# Patient Record
Sex: Female | Born: 1965 | Race: White | Hispanic: No | Marital: Married | Smoking: Never smoker
Health system: Southern US, Community
[De-identification: ages and names within clinical notes are randomized; demographics above are authoritative.]

## PROBLEM LIST (undated history)

## (undated) DIAGNOSIS — N921 Excessive and frequent menstruation with irregular cycle: Secondary | ICD-10-CM

## (undated) DIAGNOSIS — R519 Headache, unspecified: Secondary | ICD-10-CM

## (undated) DIAGNOSIS — K219 Gastro-esophageal reflux disease without esophagitis: Secondary | ICD-10-CM

## (undated) DIAGNOSIS — D649 Anemia, unspecified: Secondary | ICD-10-CM

## (undated) DIAGNOSIS — I809 Phlebitis and thrombophlebitis of unspecified site: Secondary | ICD-10-CM

## (undated) DIAGNOSIS — M1990 Unspecified osteoarthritis, unspecified site: Secondary | ICD-10-CM

## (undated) DIAGNOSIS — R002 Palpitations: Secondary | ICD-10-CM

## (undated) DIAGNOSIS — R6889 Other general symptoms and signs: Secondary | ICD-10-CM

## (undated) DIAGNOSIS — R51 Headache: Secondary | ICD-10-CM

## (undated) DIAGNOSIS — L0390 Cellulitis, unspecified: Secondary | ICD-10-CM

## (undated) HISTORY — DX: Excessive and frequent menstruation with irregular cycle: N92.1

## (undated) HISTORY — PX: BUNIONECTOMY: SHX129

## (undated) HISTORY — DX: Palpitations: R00.2

## (undated) HISTORY — DX: Cellulitis, unspecified: L03.90

## (undated) HISTORY — DX: Phlebitis and thrombophlebitis of unspecified site: I80.9

## (undated) HISTORY — PX: VAGINAL HYSTERECTOMY: SUR661

## (undated) HISTORY — PX: OTHER SURGICAL HISTORY: SHX169

---

## 1997-05-17 ENCOUNTER — Ambulatory Visit (HOSPITAL_COMMUNITY): Admission: RE | Admit: 1997-05-17 | Discharge: 1997-05-17 | Payer: Self-pay | Admitting: Obstetrics and Gynecology

## 1999-05-22 ENCOUNTER — Other Ambulatory Visit: Admission: RE | Admit: 1999-05-22 | Discharge: 1999-05-22 | Payer: Self-pay | Admitting: Obstetrics and Gynecology

## 2000-04-25 ENCOUNTER — Observation Stay (HOSPITAL_COMMUNITY): Admission: RE | Admit: 2000-04-25 | Discharge: 2000-04-26 | Payer: Self-pay | Admitting: Physical Therapy

## 2000-04-25 ENCOUNTER — Encounter (INDEPENDENT_AMBULATORY_CARE_PROVIDER_SITE_OTHER): Payer: Self-pay

## 2000-04-25 DIAGNOSIS — N921 Excessive and frequent menstruation with irregular cycle: Secondary | ICD-10-CM

## 2000-04-25 HISTORY — DX: Excessive and frequent menstruation with irregular cycle: N92.1

## 2001-05-27 ENCOUNTER — Encounter: Admission: RE | Admit: 2001-05-27 | Discharge: 2001-05-27 | Payer: Self-pay | Admitting: Otolaryngology

## 2001-05-27 ENCOUNTER — Encounter: Payer: Self-pay | Admitting: Otolaryngology

## 2002-02-25 ENCOUNTER — Encounter: Admission: RE | Admit: 2002-02-25 | Discharge: 2002-02-25 | Payer: Self-pay | Admitting: Family Medicine

## 2002-02-25 ENCOUNTER — Encounter: Payer: Self-pay | Admitting: Family Medicine

## 2002-08-11 ENCOUNTER — Other Ambulatory Visit: Admission: RE | Admit: 2002-08-11 | Discharge: 2002-08-11 | Payer: Self-pay | Admitting: Obstetrics and Gynecology

## 2003-09-21 ENCOUNTER — Other Ambulatory Visit: Admission: RE | Admit: 2003-09-21 | Discharge: 2003-09-21 | Payer: Self-pay | Admitting: Obstetrics and Gynecology

## 2004-10-11 ENCOUNTER — Other Ambulatory Visit: Admission: RE | Admit: 2004-10-11 | Discharge: 2004-10-11 | Payer: Self-pay | Admitting: Obstetrics and Gynecology

## 2005-06-29 ENCOUNTER — Encounter: Admission: RE | Admit: 2005-06-29 | Discharge: 2005-06-29 | Payer: Self-pay | Admitting: Otolaryngology

## 2006-08-07 ENCOUNTER — Encounter: Admission: RE | Admit: 2006-08-07 | Discharge: 2006-08-07 | Payer: Self-pay | Admitting: Family Medicine

## 2007-04-07 ENCOUNTER — Encounter: Admission: RE | Admit: 2007-04-07 | Discharge: 2007-04-07 | Payer: Self-pay | Admitting: Obstetrics and Gynecology

## 2007-07-09 ENCOUNTER — Ambulatory Visit (HOSPITAL_COMMUNITY): Admission: RE | Admit: 2007-07-09 | Discharge: 2007-07-09 | Payer: Self-pay | Admitting: Family Medicine

## 2009-09-07 ENCOUNTER — Ambulatory Visit (HOSPITAL_COMMUNITY): Admission: RE | Admit: 2009-09-07 | Discharge: 2009-09-07 | Payer: Self-pay | Admitting: Family Medicine

## 2010-02-20 ENCOUNTER — Encounter (HOSPITAL_COMMUNITY)
Admission: RE | Admit: 2010-02-20 | Discharge: 2010-05-02 | Payer: Self-pay | Source: Home / Self Care | Attending: Endocrinology | Admitting: Endocrinology

## 2010-08-18 NOTE — Op Note (Signed)
[facility]  Patient:    [name], [name]                          MRN: [id number] Proc. Date: 04/25/00 Attending:  Trevor Iha, M.D.                           Operative Report  PREOPERATIVE DIAGNOSIS:       Menometrorrhagia.  POSTOPERATIVE DIAGNOSES:      1. Menometrorrhagia.                               2. Right paratubal cyst x 2.  OPERATION/PROCEDURE:          1. Laparoscopically assisted vaginal                                  hysterectomy.                               2. Removal of right paratubal cysts.  SURGEON:                      Trevor Iha, M.D.  ASSISTANT:                    Raynald Kemp, M.D.  ANESTHESIA:                   General endotracheal.  INDICATIONS FOR PROCEDURE:    Ms. Herring is a 45 year old G3 P3 with worsening menometrorrhagia not responsive to conservative medical management including anti-inflammatory medications, oral contraceptive agents, or dilatation and curettage.  She desires definitive surgical intervention and requests hysterectomy.  The plan is for laparoscopically assisted vaginal hysterectomy. The risks and benefits were discussed at length and informed consent was obtained.  See the History of Present Illness for further details.  FINDINGS:                     At the time of surgery there was a normal appearing appendix noted and liver.  The ovaries were normal bilaterally.  The The right tube had two hydatid cysts of Morgagni or paratubal cysts, which were removed.  DESCRIPTION OF PROCEDURE:     After adequate general anesthesia was obtained the patient was placed in the dorsal lithotomy position on the operating room table and was sterile prepped and draped.  A Hulka tenaculum was sterilely placed and the bladder was sterilely drained.  A 1 cm infraumbilical skin incision was made and a Veress needle inserted.  The abdomen was insufflated with dullness to percussion and an 11 mm trocar  inserted.  A five mm trocar was inserted two fingerbreadths below the pubic symphysis just to the left of the midline under direct visualization.  The above findings were noted.  An Endo GIA was inserted and placed across the left utero-ovarian ligament, across to the round ligament, and fired one time.  The ovary and tube fell away nicely from the uterus and good hemostasis was achieved.  This was done similarly on the right side with one firing of the Endo GIA, and good application was noted and hemostasis was noted after removal.  At this time the laparoscope  was removed, the Hulka tenaculum removed, and a Jacobs tenaculum placed on the cervix, and a weighted speculum placed.  Posterior colpotomy incision was made and the cervix was circumscribed with Bovie cautery.  Heaney clamps were placed across the uterosacral ligaments bilaterally and they were clamped, cut, and tied, with 0 Monocryl used throughout the remainder of the procedure unless otherwise indicated, doubly ligated with 0 Monocryl suture.  The cardinal ligaments were then clamped, cut, and tied.  The bladder was then dissected off the anterior surface of the cervix and replaced underneath a Deaver retractor, and at this time successive bites along the uterine vasculature up and across the round ligaments were performed.  At this time the fundus of the uterus was grasped with a thyroid tenaculum and delivered up to the introitus, and the remaining pedicle was clamped bilaterally with Heaney clamps, cut with Mayo scissors, and the uterus removed intact.  Many pedicles were doubly ligated and at this time a fair amount of bleeding was noted from the posterior vaginal cuff.  It was contained with Bovie cautery and at this time a small packing was placed. Sutures were placed at the uterosacral ligaments bilaterally.  The posterior peritoneum was then closed in pursestring fashion.  The posterior vagina was closed in vertical  fashion using figure-of-eight 0 Monocryl with good approximation and good hemostasis.  The tenaculum was then removed and the anterior vagina then closed with figure-of-eight sutures of 0 Monocryl with good approximation and good hemostasis noted.  At this time the speculum was removed and the Foley catheter sterilely placed with good return of clear urine.  The abdomen was again insufflated and at this time Bovie cautery was used to catheterize the base of the right paratubal cyst.  It was sharply excised and removed and the ______ suction irrigator was used to irrigate the cul-de-sac and also the right tube.  Hemostasis was achieved with bipolar cautery.  After adequate hemostasis was assured and after copious irrigation the laparoscope and trocars were removed.  The infraumbilical skin incision was closed with 0 Vicryl in the fascia and 3-0 Vicryl Rapide subcuticular stitch in the skin.  The 5 mm trocar site was closed with 3-0 Vicryl Rapide subcuticular stitch.  There was good approximation and good hemostasis noted. They were injected with Marcaine 0.25%.  The patient tolerated the procedure well and was taken to the recovery room.  Estimated blood loss for the procedure was 325 cc.  The patient received Cipro 400 mg IV preoperatively. DD:  04/25/00 TD:  04/25/00 Job: [id number]

## 2010-08-18 NOTE — Discharge Summary (Signed)
[facility]  Patient:    [name], [name]                        MRN: [id number] Adm. Date:  [id number] Disc. Date: 04/26/00 Attending:  Trevor Iha                           Discharge Summary  HISTORY OF PRESENT ILLNESS:   [name] is a 45 year old G3, P3 with menometrorrhagia not responsive to oral contraceptive agents, D&C, or anti-inflammatory medications.  She presents for definitive surgical correction and presented for hysterectomy.  HOSPITAL COURSE:              Patient was admitted same day of surgery. Underwent a laparoscopically assisted vaginal hysterectomy and removal of right peritubal cyst.  Surgery was uncomplicated.  Estimated blood loss 325 cc.  Her postoperative course was uncomplicated.  She had good return of bowel function and ambulation.  Able to urinate without a catheter.  By postoperative day #1 was ambulating without difficulty, tolerating a regular diet.  She did have temperature of 101.1 on the morning of postoperative day #1.  Had IV site that had infiltrated and mild phlebitis.  The IV was removed and she rapidly defervesced.  Incision was clean, dry, and intact at the time of discharge.  Bowel sounds were positive.  Otherwise normal physical examination.  Hemoglobin postoperative day #1 was 9.0 and a normal white blood count.  DISPOSITION:                  Patient will be discharged home.  Will follow-up in the office in one and a half weeks.  She was told to return for any increased bleeding, pelvic pain, or fever greater than 100.5.  She was sent home with a prescription for Motrin 800 mg, Tylox #30, and a Z-pak for the phlebitis and possible skin cellulitis.  CONDITION ON DISCHARGE:       Good. DD:  04/26/00 TD:  04/26/00 Job: [id number]

## 2010-08-25 ENCOUNTER — Encounter: Payer: Self-pay | Admitting: Cardiology

## 2010-08-30 ENCOUNTER — Ambulatory Visit (INDEPENDENT_AMBULATORY_CARE_PROVIDER_SITE_OTHER): Payer: 59 | Admitting: Cardiology

## 2010-08-30 ENCOUNTER — Encounter: Payer: Self-pay | Admitting: Cardiology

## 2010-08-30 VITALS — BP 122/70 | HR 58 | Resp 14 | Ht 66.0 in | Wt 182.0 lb

## 2010-08-30 DIAGNOSIS — R002 Palpitations: Secondary | ICD-10-CM

## 2010-08-30 DIAGNOSIS — I4949 Other premature depolarization: Secondary | ICD-10-CM

## 2010-08-30 DIAGNOSIS — I493 Ventricular premature depolarization: Secondary | ICD-10-CM

## 2010-08-30 MED ORDER — METOPROLOL SUCCINATE ER 25 MG PO TB24: 25.0000 mg | ORAL_TABLET | Freq: Two times a day (BID) | ORAL | Status: DC

## 2010-08-30 NOTE — Progress Notes (Signed)
[name] is referred today by Dr. Rana Snare for the evaluation and management of continuing palpitations.  She is currently 45 years of age. He has had a history of palpitations in the past and has been told she has premature beats. After menopause, she developed Graves' disease. During that time her palpitations were significantly worse. After being treated, and being currently euthyroid, she still having a fair amount of PVCs.  There were some she lies down or when she gets up. They also seem to be worse with activity. She denies any chest discomfort or angina. She's had no syncope or presyncope.  She is not had an echocardiogram.  EKG today shows normal sinus rhythm with a normal PR, normal QRS and normal QTC. She does have a PVC.  HPI  Past Medical History  Diagnosis Date  . Menometrorrhagia 04/25/2000    Right parotubal cyst x 2.  . Phlebitis   . Cellulitis   . Palpitation     Past Surgical History  Procedure Date  . Vaginal hysterectomy     removal of paratubal cyst.    No family history on file.  History   Social History  . Marital Status: Married    Spouse Name: N/A    Number of Children: N/A  . Years of Education: N/A   Occupational History  . Not on file.   Social History Main Topics  . Smoking status: Never Smoker   . Smokeless tobacco: Not on file  . Alcohol Use: Not on file  . Drug Use: Not on file  . Sexually Active: Not on file   Other Topics Concern  . Not on file   Social History Narrative  . No narrative on file    Allergies  Allergen Reactions  . Penicillins   . Sulfa Drugs Cross Reactors     Current Outpatient Prescriptions  Medication Sig Dispense Refill  . escitalopram (LEXAPRO) 10 MG tablet Take 10 mg by mouth daily.        Marland Kitchen estradiol (ESTRACE) 2 MG tablet Take 2 mg by mouth daily.        Marland Kitchen levothyroxine (SYNTHROID, LEVOTHROID) 88 MCG tablet Take 88 mcg by mouth daily.        . metoprolol succinate (TOPROL-XL) 25 MG 24 hr tablet Take  1 tablet (25 mg total) by mouth 2 (two) times daily.  60 tablet  11  . DISCONTD: metoprolol succinate (TOPROL-XL) 25 MG 24 hr tablet Take 25 mg by mouth daily.          ROS Negative other than HPI.   PE General Appearance: well developed, well nourished in no acute distress HEENT: symmetrical face, PERRLA, good dentition  Neck: no JVD, thyromegaly, or adenopathy, trachea midline Chest: symmetric without deformity Cardiac: PMI non-displaced, RRR, normal S1, S2, no gallop or murmur Lung: clear to ausculation and percussion Vascular: all pulses full without bruits  Abdominal: nondistended, nontender, good bowel sounds, no HSM, no bruits Extremities: no cyanosis, clubbing or edema, no sign of DVT, no varicosities  Skin: normal color, no rashes Neuro: alert and oriented x 3, non-focal Pysch: normal affect Filed Vitals:   08/30/10 1526  BP: 122/70  Pulse: 58  Resp: 14  Height: 5\' 6"  (1.676 m)  Weight: 182 lb (82.555 kg)    EKG  Labs and Studies Reviewed.   No results found for this basename: WBC, HGB, HCT, MCV, PLT      Chemistry   No results found for this basename: NA, K,  CL, CO2, BUN, CREATININE, GLU   No results found for this basename: CALCIUM, ALKPHOS, AST, ALT, BILITOT       No results found for this basename: CHOL   No results found for this basename: HDL   No results found for this basename: LDLCALC   No results found for this basename: TRIG   No results found for this basename: CHOLHDL   No results found for this basename: HGBA1C   No results found for this basename: ALT, AST, GGT, ALKPHOS, BILITOT   No results found for this basename: TSH

## 2010-08-30 NOTE — Patient Instructions (Signed)
Your physician has requested that you have an echocardiogram. Echocardiography is a painless test that uses sound waves to create images of your heart. It provides your doctor with information about the size and shape of your heart and how well your heart's chambers and valves are working. This procedure takes approximately one hour. There are no restrictions for this procedure.  Your physician has recommended you make the following change in your medication:  Increase Toprol to twice a day  Your physician recommends that you schedule a follow-up appointment in: 4 weeks with Dr. Daleen Squibb

## 2010-08-31 ENCOUNTER — Ambulatory Visit (HOSPITAL_COMMUNITY): Payer: 59 | Attending: Family Medicine | Admitting: Radiology

## 2010-08-31 DIAGNOSIS — I080 Rheumatic disorders of both mitral and aortic valves: Secondary | ICD-10-CM | POA: Insufficient documentation

## 2010-08-31 DIAGNOSIS — I493 Ventricular premature depolarization: Secondary | ICD-10-CM

## 2010-08-31 DIAGNOSIS — I079 Rheumatic tricuspid valve disease, unspecified: Secondary | ICD-10-CM | POA: Insufficient documentation

## 2010-08-31 DIAGNOSIS — R002 Palpitations: Secondary | ICD-10-CM | POA: Insufficient documentation

## 2010-09-01 ENCOUNTER — Telehealth: Payer: Self-pay | Admitting: *Deleted

## 2010-09-01 NOTE — Telephone Encounter (Signed)
Pt aware of echo results Debbie Danniella Robben RN  

## 2010-10-09 ENCOUNTER — Ambulatory Visit (INDEPENDENT_AMBULATORY_CARE_PROVIDER_SITE_OTHER): Payer: 59 | Admitting: Cardiology

## 2010-10-09 ENCOUNTER — Encounter: Payer: Self-pay | Admitting: Cardiology

## 2010-10-09 VITALS — BP 100/72 | HR 60 | Resp 14 | Ht 66.0 in | Wt 184.0 lb

## 2010-10-09 DIAGNOSIS — R002 Palpitations: Secondary | ICD-10-CM

## 2010-10-09 NOTE — Progress Notes (Signed)
HPI [name] comes in today for followup of her palpitations. Her echocardiogram was essentially normal except for minimal mitral regurgitation. Her palpitations improved 90+ percent on the metoprolol regimen. She seems very pleased. He's had no further chest pain. Past Medical History  Diagnosis Date  . Menometrorrhagia 04/25/2000    Right parotubal cyst x 2.  . Phlebitis   . Cellulitis   . Palpitation     Past Surgical History  Procedure Date  . Vaginal hysterectomy     removal of paratubal cyst.    No family history on file.  History   Social History  . Marital Status: Married    Spouse Name: N/A    Number of Children: N/A  . Years of Education: N/A   Occupational History  . Not on file.   Social History Main Topics  . Smoking status: Never Smoker   . Smokeless tobacco: Not on file  . Alcohol Use: Not on file  . Drug Use: Not on file  . Sexually Active: Not on file   Other Topics Concern  . Not on file   Social History Narrative  . No narrative on file    Allergies  Allergen Reactions  . Penicillins   . Sulfa Drugs Cross Reactors     Current Outpatient Prescriptions  Medication Sig Dispense Refill  . estradiol (ESTRACE) 2 MG tablet Take 2 mg by mouth daily.        Marland Kitchen levothyroxine (SYNTHROID, LEVOTHROID) 100 MCG tablet Take 100 mcg by mouth daily.        . metoprolol succinate (TOPROL-XL) 25 MG 24 hr tablet Take 1 tablet (25 mg total) by mouth 2 (two) times daily.  60 tablet  11  . DISCONTD: escitalopram (LEXAPRO) 10 MG tablet Take 10 mg by mouth daily.        Marland Kitchen DISCONTD: levothyroxine (SYNTHROID, LEVOTHROID) 88 MCG tablet Take 88 mcg by mouth daily.          ROS Negative other than HPI.   PE General Appearance: well developed, well nourished in no acute distress HEENT: symmetrical face, PERRLA, good dentition  Neck: no JVD, thyromegaly, or adenopathy, trachea midline Chest: symmetric without deformity Cardiac: PMI non-displaced, RRR, normal S1,  S2, no gallop or murmur Lung: clear to ausculation and percussion Vascular: all pulses full without bruits  Abdominal: nondistended, nontender, good bowel sounds, no HSM, no bruits Extremities: no cyanosis, clubbing or edema, no sign of DVT, no varicosities  Skin: normal color, no rashes Neuro: alert and oriented x 3, non-focal Pysch: normal affect Filed Vitals:   10/09/10 1603  BP: 100/72  Pulse: 60  Resp: 14  Height: 5\' 6"  (1.676 m)  Weight: 184 lb (83.462 kg)    EKG  Labs and Studies Reviewed.   No results found for this basename: WBC, HGB, HCT, MCV, PLT      Chemistry   No results found for this basename: NA, K, CL, CO2, BUN, CREATININE, GLU   No results found for this basename: CALCIUM, ALKPHOS, AST, ALT, BILITOT       No results found for this basename: CHOL   No results found for this basename: HDL   No results found for this basename: LDLCALC   No results found for this basename: TRIG   No results found for this basename: CHOLHDL   No results found for this basename: HGBA1C   No results found for this basename: ALT, AST, GGT, ALKPHOS, BILITOT   No results found for this  basename: TSH  the

## 2010-10-09 NOTE — Assessment & Plan Note (Signed)
These are markedly improved. I made no other changes. I have told her that she hasn't taken that day she can take an extra metoprolol since she is on a low dose at present. I will see her back p.r.n.

## 2010-10-09 NOTE — Patient Instructions (Signed)
Your physician has recommended you make the following change in your medication:  You may take an extra metoprolol if needed.  Your physician recommends that you schedule a follow-up appointment in:  As  Needed with Dr. Daleen Squibb

## 2011-01-05 ENCOUNTER — Other Ambulatory Visit (HOSPITAL_COMMUNITY): Payer: Self-pay | Admitting: Urology

## 2011-01-05 DIAGNOSIS — N342 Other urethritis: Secondary | ICD-10-CM

## 2011-01-05 DIAGNOSIS — N361 Urethral diverticulum: Secondary | ICD-10-CM

## 2011-01-21 ENCOUNTER — Inpatient Hospital Stay (HOSPITAL_COMMUNITY): Admission: RE | Admit: 2011-01-21 | Payer: 59 | Source: Ambulatory Visit

## 2011-01-22 ENCOUNTER — Ambulatory Visit (HOSPITAL_COMMUNITY)
Admission: RE | Admit: 2011-01-22 | Discharge: 2011-01-22 | Disposition: A | Discharge status: Home / Self Care | Payer: 59 | Source: Ambulatory Visit | Attending: Urology | Admitting: Urology

## 2011-01-22 DIAGNOSIS — N342 Other urethritis: Secondary | ICD-10-CM | POA: Insufficient documentation

## 2011-01-22 DIAGNOSIS — N361 Urethral diverticulum: Secondary | ICD-10-CM | POA: Insufficient documentation

## 2011-01-22 DIAGNOSIS — N2889 Other specified disorders of kidney and ureter: Secondary | ICD-10-CM | POA: Insufficient documentation

## 2011-01-22 DIAGNOSIS — N368 Other specified disorders of urethra: Secondary | ICD-10-CM | POA: Insufficient documentation

## 2011-01-22 MED ORDER — GADOBENATE DIMEGLUMINE 529 MG/ML IV SOLN
20.0000 mL | Freq: Once | INTRAVENOUS | Status: AC | PRN
  Administered 2011-01-22: 17 mL via INTRAVENOUS

## 2012-05-05 ENCOUNTER — Other Ambulatory Visit: Payer: Self-pay | Admitting: Obstetrics and Gynecology

## 2012-05-05 DIAGNOSIS — R928 Other abnormal and inconclusive findings on diagnostic imaging of breast: Secondary | ICD-10-CM

## 2012-05-14 ENCOUNTER — Ambulatory Visit
Admission: RE | Admit: 2012-05-14 | Discharge: 2012-05-14 | Disposition: A | Discharge status: Home / Self Care | Payer: PRIVATE HEALTH INSURANCE | Source: Ambulatory Visit | Attending: Obstetrics and Gynecology | Admitting: Obstetrics and Gynecology

## 2012-05-14 DIAGNOSIS — R928 Other abnormal and inconclusive findings on diagnostic imaging of breast: Secondary | ICD-10-CM

## 2014-06-15 ENCOUNTER — Other Ambulatory Visit: Payer: Self-pay | Admitting: Gastroenterology

## 2014-06-15 DIAGNOSIS — R1310 Dysphagia, unspecified: Secondary | ICD-10-CM

## 2014-06-21 ENCOUNTER — Ambulatory Visit
Admission: RE | Admit: 2014-06-21 | Discharge: 2014-06-21 | Disposition: A | Discharge status: Home / Self Care | Payer: PRIVATE HEALTH INSURANCE | Source: Ambulatory Visit | Attending: Gastroenterology | Admitting: Gastroenterology

## 2014-06-21 DIAGNOSIS — R1310 Dysphagia, unspecified: Secondary | ICD-10-CM

## 2014-06-22 ENCOUNTER — Other Ambulatory Visit: Payer: Self-pay | Admitting: Obstetrics and Gynecology

## 2014-06-22 DIAGNOSIS — R928 Other abnormal and inconclusive findings on diagnostic imaging of breast: Secondary | ICD-10-CM

## 2014-06-25 ENCOUNTER — Ambulatory Visit
Admission: RE | Admit: 2014-06-25 | Discharge: 2014-06-25 | Disposition: A | Discharge status: Home / Self Care | Payer: PRIVATE HEALTH INSURANCE | Source: Ambulatory Visit | Attending: Obstetrics and Gynecology | Admitting: Obstetrics and Gynecology

## 2014-06-25 DIAGNOSIS — R928 Other abnormal and inconclusive findings on diagnostic imaging of breast: Secondary | ICD-10-CM

## 2014-06-30 ENCOUNTER — Encounter (HOSPITAL_COMMUNITY): Payer: Self-pay | Admitting: *Deleted

## 2014-07-28 ENCOUNTER — Encounter (HOSPITAL_COMMUNITY): Payer: Self-pay | Admitting: *Deleted

## 2014-08-02 ENCOUNTER — Other Ambulatory Visit: Payer: Self-pay | Admitting: Gastroenterology

## 2014-08-03 NOTE — Addendum Note (Signed)
Addended by: Dustine Stickler JR. on: 08/03/2014 09:09 AM   Modules accepted: Orders  

## 2014-08-05 ENCOUNTER — Ambulatory Visit (HOSPITAL_COMMUNITY): Payer: PRIVATE HEALTH INSURANCE | Admitting: Certified Registered Nurse Anesthetist

## 2014-08-05 ENCOUNTER — Encounter (HOSPITAL_COMMUNITY)
Admission: RE | Disposition: A | Discharge status: Home / Self Care | Payer: Self-pay | Source: Ambulatory Visit | Attending: Gastroenterology

## 2014-08-05 ENCOUNTER — Ambulatory Visit (HOSPITAL_COMMUNITY)
Admission: RE | Admit: 2014-08-05 | Discharge: 2014-08-05 | Disposition: A | Discharge status: Home / Self Care | Payer: PRIVATE HEALTH INSURANCE | Source: Ambulatory Visit | Attending: Gastroenterology | Admitting: Gastroenterology

## 2014-08-05 ENCOUNTER — Ambulatory Visit (HOSPITAL_COMMUNITY): Payer: PRIVATE HEALTH INSURANCE

## 2014-08-05 ENCOUNTER — Encounter (HOSPITAL_COMMUNITY): Payer: Self-pay | Admitting: *Deleted

## 2014-08-05 DIAGNOSIS — K219 Gastro-esophageal reflux disease without esophagitis: Secondary | ICD-10-CM | POA: Diagnosis not present

## 2014-08-05 DIAGNOSIS — Z791 Long term (current) use of non-steroidal anti-inflammatories (NSAID): Secondary | ICD-10-CM | POA: Insufficient documentation

## 2014-08-05 DIAGNOSIS — M1990 Unspecified osteoarthritis, unspecified site: Secondary | ICD-10-CM | POA: Diagnosis not present

## 2014-08-05 DIAGNOSIS — Z79899 Other long term (current) drug therapy: Secondary | ICD-10-CM | POA: Diagnosis not present

## 2014-08-05 DIAGNOSIS — K224 Dyskinesia of esophagus: Secondary | ICD-10-CM | POA: Insufficient documentation

## 2014-08-05 DIAGNOSIS — R1310 Dysphagia, unspecified: Secondary | ICD-10-CM | POA: Diagnosis present

## 2014-08-05 HISTORY — DX: Headache, unspecified: R51.9

## 2014-08-05 HISTORY — DX: Headache: R51

## 2014-08-05 HISTORY — DX: Gastro-esophageal reflux disease without esophagitis: K21.9

## 2014-08-05 HISTORY — DX: Unspecified osteoarthritis, unspecified site: M19.90

## 2014-08-05 HISTORY — DX: Anemia, unspecified: D64.9

## 2014-08-05 HISTORY — PX: ESOPHAGOGASTRODUODENOSCOPY (EGD) WITH PROPOFOL: SHX5813

## 2014-08-05 HISTORY — DX: Other general symptoms and signs: R68.89

## 2014-08-05 SURGERY — ESOPHAGOGASTRODUODENOSCOPY (EGD) WITH PROPOFOL
Anesthesia: Monitor Anesthesia Care

## 2014-08-05 MED ORDER — PROPOFOL 10 MG/ML IV BOLUS
INTRAVENOUS | Status: AC
  Filled 2014-08-05: qty 20

## 2014-08-05 MED ORDER — SODIUM CHLORIDE 0.9 % IV SOLN: INTRAVENOUS | Status: DC

## 2014-08-05 MED ORDER — PROPOFOL INFUSION 10 MG/ML OPTIME
INTRAVENOUS | Status: DC | PRN
  Administered 2014-08-05: 300 ug/kg/min via INTRAVENOUS

## 2014-08-05 MED ORDER — LIDOCAINE HCL (CARDIAC) 20 MG/ML IV SOLN
INTRAVENOUS | Status: AC
  Filled 2014-08-05: qty 5

## 2014-08-05 MED ORDER — BUTAMBEN-TETRACAINE-BENZOCAINE 2-2-14 % EX AERO
INHALATION_SPRAY | CUTANEOUS | Status: DC | PRN
  Administered 2014-08-05: 2 via TOPICAL

## 2014-08-05 MED ORDER — KETAMINE HCL 10 MG/ML IJ SOLN
INTRAMUSCULAR | Status: DC | PRN
  Administered 2014-08-05: 20 mg via INTRAVENOUS

## 2014-08-05 MED ORDER — LIDOCAINE HCL (CARDIAC) 20 MG/ML IV SOLN
INTRAVENOUS | Status: DC | PRN
  Administered 2014-08-05: 100 mg via INTRAVENOUS

## 2014-08-05 MED ORDER — ONDANSETRON HCL 4 MG/2ML IJ SOLN
INTRAMUSCULAR | Status: AC
  Filled 2014-08-05: qty 2

## 2014-08-05 MED ORDER — ONDANSETRON HCL 4 MG/2ML IJ SOLN
INTRAMUSCULAR | Status: DC | PRN
  Administered 2014-08-05: 4 mg via INTRAVENOUS

## 2014-08-05 MED ORDER — LACTATED RINGERS IV SOLN
INTRAVENOUS | Status: DC
  Administered 2014-08-05: 1000 mL via INTRAVENOUS

## 2014-08-05 SURGICAL SUPPLY — 14 items

## 2014-08-05 NOTE — Anesthesia Postprocedure Evaluation (Signed)
  Anesthesia Post-op Note  Patient: [name]  Procedure(s) Performed: Procedure(s): ESOPHAGOGASTRODUODENOSCOPY (EGD) WITH PROPOFOL (N/A)  Patient Location: PACU and Endoscopy Unit  Anesthesia Type:MAC  Level of Consciousness: awake  Airway and Oxygen Therapy: Patient Spontanous Breathing  Post-op Pain: mild  Post-op Assessment: Post-op Vital signs reviewed  Post-op Vital Signs: Reviewed  Last Vitals:  Filed Vitals:   08/05/14 1110  BP: 125/60  Pulse: 71  Temp:   Resp: 14    Complications: No apparent anesthesia complications

## 2014-08-05 NOTE — Anesthesia Preprocedure Evaluation (Addendum)
Anesthesia Evaluation  Patient identified by MRN, date of birth, ID band Patient awake    Reviewed: Allergy & Precautions, NPO status , Patient's Chart, lab work & pertinent test results  Airway Mallampati: I  TM Distance: >3 FB Neck ROM: Full    Dental   Pulmonary neg pulmonary ROS,  breath sounds clear to auscultation        Cardiovascular negative cardio ROS  Rhythm:Regular Rate:Normal     Neuro/Psych    GI/Hepatic Neg liver ROS, GERD-  ,  Endo/Other  negative endocrine ROS  Renal/GU negative Renal ROS     Musculoskeletal  (+) Arthritis -,   Abdominal   Peds  Hematology  (+) anemia ,   Anesthesia Other Findings   Reproductive/Obstetrics                             Anesthesia Physical Anesthesia Plan  ASA: II  Anesthesia Plan: MAC   Post-op Pain Management:    Induction: Intravenous  Airway Management Planned: Nasal Cannula  Additional Equipment:   Intra-op Plan:   Post-operative Plan:   Informed Consent: I have reviewed the patients History and Physical, chart, labs and discussed the procedure including the risks, benefits and alternatives for the proposed anesthesia with the patient or authorized representative who has indicated his/her understanding and acceptance.   Dental advisory given  Plan Discussed with: CRNA, Anesthesiologist and Surgeon  Anesthesia Plan Comments:        Anesthesia Quick Evaluation

## 2014-08-05 NOTE — H&P (Signed)
  Subjective:   Patient is a 49 y.o. female presents with dysphagia. Barium swallow showed poor motility possible hangup at the GE junction to some extent. EGD to evaluate this area is to be performed with Savary dilatation to see if this will help.. Procedure including risks and benefits discussed in office.  Patient Active Problem List   Diagnosis Date Noted  . Heart palpitations 08/30/2010   Past Medical History  Diagnosis Date  . Menometrorrhagia 04/25/2000    Right parotubal cyst x 2.  . Phlebitis   . Cellulitis     in past with surgery, and occassional swelling of feet and legs in past  . Palpitation     caused by high thyroid in past  . Headache     occasional migraines  . GERD (gastroesophageal reflux disease)   . Arthritis   . Anemia     chronic anemia, does not tolerate iron  . Influenza-like symptoms     07-24-14 "fever, cough, body aches" started Tamiflu-has had 2 doses, now afebrile; cough remains productive with some chest congestion-mucus varies yellow to tan-is feeling better after Tamiflu started. pt will make Dr. Randa EvensEdwards aware.    Past Surgical History  Procedure Laterality Date  . Vaginal hysterectomy      removal of paratubal cyst.  . Thumb surgery Left   . Bunionectomy Bilateral     Prescriptions prior to admission  Medication Sig Dispense Refill Last Dose  . dexlansoprazole (DEXILANT) 60 MG capsule Take 60 mg by mouth daily.   08/04/2014 at 0700  . estradiol (ESTRACE) 2 MG tablet Take 2 mg by mouth daily.     Past Week at Unknown time  . levothyroxine (SYNTHROID, LEVOTHROID) 100 MCG tablet Take 100 mcg by mouth daily.     08/05/2014 at 0615  . naproxen sodium (ANAPROX) 220 MG tablet Take 220 mg by mouth 2 (two) times daily with a meal.   Past Week at Unknown time  . metoprolol succinate (TOPROL-XL) 25 MG 24 hr tablet Take 1 tablet (25 mg total) by mouth 2 (two) times daily. (Patient not taking: Reported on 06/29/2014) 60 tablet 11 Taking   Allergies  Allergen  Reactions  . Penicillins     Cant remember  . Sulfa Drugs Cross Reactors Nausea And Vomiting    Hives,rash with topicals    History  Substance Use Topics  . Smoking status: Never Smoker   . Smokeless tobacco: Never Used  . Alcohol Use: No    History reviewed. No pertinent family history.   Objective:   Patient Vitals for the past 8 hrs:  BP Temp Temp src Pulse Resp SpO2 Height Weight  08/05/14 0906 (!) 150/76 mmHg 98 F (36.7 C) Oral 68 17 100 % 5\' 6"  (1.676 m) 90.719 kg (200 lb)         See MD Preop evaluation      Assessment:   1. Dysphagia possibly due to motility disorder versus distal stricture versus both  Plan:   We will proceed with EGD with esophageal dilatation if indicated. The risk and benefits of the procedure have been discussed in detail with the patient in the office.

## 2014-08-05 NOTE — Op Note (Signed)
[facility] [address] [place], [phone]   ENDOSCOPY PROCEDURE REPORT  PATIENT: [name], [name]  MR#: [id number] BIRTHDATE: 1965-10-28 , 48  yrs. old GENDER: female ENDOSCOPIST:[place] Randa EvensEdwards, MD REFERRED BY: Maurice SmallElaine Griffin, M.D. PROCEDURE DATE:  08/05/2014 PROCEDURE:   EGD, diagnostic   Plans to perform Savary dilatation with fluoroscopic guidance if needed. ASA CLASS:    Class II INDICATIONS: DYSPHAGIA WITH BARIUM SWALLOW SHOWING DYSMOTILITY AS WELL AS POSSIBLE NARROWING AT THE ge JUNCTION.Marland Kitchen. MEDICATION: Propofol 200 mg IV TOPICAL ANESTHETIC:   Cetacaine Spray  DESCRIPTION OF PROCEDURE:   After the risks and benefits of the procedure were explained, informed consent was obtained.  The Pentax Gastroscope [id number]  endoscope was introduced through the mouth  and advanced to the second portion of the duodenum .  The instrument was slowly withdrawn as the mucosa was fully examined. Estimated blood loss is zero unless otherwise noted in this procedure report.      ESOPHAGUS: Widely patent esophagus with spasm.  GE junction widely patent with no stricture.  No dilatation performed.  No Barrett's or esophagitis seen.  STOMACH: The mucosa of the stomach appeared normal.  DUODENUM: The duodenal mucosa showed no abnormalities. Retroflexed views revealed no abnormalities.    The scope was then withdrawn from the patient and the procedure completed.  COMPLICATIONS: There were no immediate complications.  ENDOSCOPIC IMPRESSION: 1.   Widely patent esophagus with spasm.  GE junction widely patent with no stricture.  No dilatation performed.  No Barrett's or esophagitis seen suspect dysphagia due to esophageal dysmotility. 2.   The mucosa of the stomach appeared normal 3.   The duodenal mucosa showed no abnormalities RECOMMENDATIONS: We will continue current medications and plan on seeing patient back in the office in one  month   _______________________________ eSigned:  Carman ChingJames Tami Barren, MD 08/05/2014 11:07 AM     cc: Maurice SmallElaine Griffin, MD  CPT CODES: ICD CODES:  The ICD and CPT codes recommended by this software are interpretations from the data that the clinical staff has captured with the software.  The verification of the translation of this report to the ICD and CPT codes and modifiers is the sole responsibility of the health care institution and practicing physician where this report was generated.  [employer] will not be held responsible for the validity of the ICD and CPT codes included on this report.  AMA assumes no liability for data contained or not contained herein. CPT is [employer] rights managerregistered trademark of the [employer].  PATIENT NAME:  [name], [name] MR#: [id number]

## 2014-08-05 NOTE — Transfer of Care (Signed)
Immediate Anesthesia Transfer of Care Note  Patient: [name]  Procedure(s) Performed: Procedure(s): ESOPHAGOGASTRODUODENOSCOPY (EGD) WITH PROPOFOL (N/A)  Patient Location: PACU  Anesthesia Type:MAC  Level of Consciousness:  sedated, patient cooperative and responds to stimulation  Airway & Oxygen Therapy:Patient Spontanous Breathing and Patient connected to face mask oxgen  Post-op Assessment:  Report given to PACU RN and Post -op Vital signs reviewed and stable  Post vital signs:  Reviewed and stable  Last Vitals:  Filed Vitals:   08/05/14 0906  BP: 150/76  Pulse: 68  Temp: 36.7 C  Resp: 17    Complications: No apparent anesthesia complications

## 2014-08-06 ENCOUNTER — Encounter (HOSPITAL_COMMUNITY): Payer: Self-pay | Admitting: Gastroenterology

## 2015-06-15 DIAGNOSIS — E669 Obesity, unspecified: Secondary | ICD-10-CM | POA: Diagnosis present

## 2015-06-15 DIAGNOSIS — K219 Gastro-esophageal reflux disease without esophagitis: Secondary | ICD-10-CM | POA: Diagnosis present

## 2015-06-15 DIAGNOSIS — E0500 Thyrotoxicosis with diffuse goiter without thyrotoxic crisis or storm: Secondary | ICD-10-CM | POA: Diagnosis present

## 2015-06-15 DIAGNOSIS — D649 Anemia, unspecified: Secondary | ICD-10-CM | POA: Insufficient documentation

## 2016-10-01 DIAGNOSIS — K219 Gastro-esophageal reflux disease without esophagitis: Secondary | ICD-10-CM | POA: Insufficient documentation

## 2016-10-01 DIAGNOSIS — R490 Dysphonia: Secondary | ICD-10-CM | POA: Insufficient documentation

## 2018-08-01 DIAGNOSIS — E079 Disorder of thyroid, unspecified: Secondary | ICD-10-CM | POA: Diagnosis present

## 2018-08-01 DIAGNOSIS — M1990 Unspecified osteoarthritis, unspecified site: Secondary | ICD-10-CM | POA: Insufficient documentation

## 2019-05-27 ENCOUNTER — Ambulatory Visit
Admission: RE | Admit: 2019-05-27 | Discharge: 2019-05-27 | Disposition: A | Discharge status: Home / Self Care | Payer: PRIVATE HEALTH INSURANCE | Source: Ambulatory Visit | Attending: Physician Assistant | Admitting: Physician Assistant

## 2019-05-27 ENCOUNTER — Other Ambulatory Visit: Payer: Self-pay | Admitting: Physician Assistant

## 2019-05-27 DIAGNOSIS — R079 Chest pain, unspecified: Secondary | ICD-10-CM

## 2019-09-11 ENCOUNTER — Encounter (HOSPITAL_COMMUNITY): Payer: Self-pay | Admitting: Emergency Medicine

## 2019-09-11 ENCOUNTER — Other Ambulatory Visit: Payer: Self-pay

## 2019-09-11 ENCOUNTER — Observation Stay (HOSPITAL_COMMUNITY)
Admission: EM | Admit: 2019-09-11 | Discharge: 2019-09-12 | Disposition: A | Discharge status: Home / Self Care | Payer: PRIVATE HEALTH INSURANCE | Attending: Family Medicine | Admitting: Family Medicine

## 2019-09-11 ENCOUNTER — Ambulatory Visit (INDEPENDENT_AMBULATORY_CARE_PROVIDER_SITE_OTHER)
Admission: RE | Admit: 2019-09-11 | Discharge: 2019-09-11 | Disposition: A | Discharge status: Home / Self Care | Payer: PRIVATE HEALTH INSURANCE | Source: Ambulatory Visit

## 2019-09-11 VITALS — BP 140/75 | HR 96 | Temp 98.1°F | Resp 16

## 2019-09-11 DIAGNOSIS — D649 Anemia, unspecified: Secondary | ICD-10-CM | POA: Diagnosis not present

## 2019-09-11 DIAGNOSIS — E890 Postprocedural hypothyroidism: Secondary | ICD-10-CM | POA: Insufficient documentation

## 2019-09-11 DIAGNOSIS — K219 Gastro-esophageal reflux disease without esophagitis: Secondary | ICD-10-CM | POA: Diagnosis not present

## 2019-09-11 DIAGNOSIS — R531 Weakness: Secondary | ICD-10-CM

## 2019-09-11 DIAGNOSIS — Z79890 Hormone replacement therapy (postmenopausal): Secondary | ICD-10-CM | POA: Insufficient documentation

## 2019-09-11 DIAGNOSIS — R55 Syncope and collapse: Secondary | ICD-10-CM

## 2019-09-11 DIAGNOSIS — R5383 Other fatigue: Secondary | ICD-10-CM

## 2019-09-11 DIAGNOSIS — D519 Vitamin B12 deficiency anemia, unspecified: Secondary | ICD-10-CM | POA: Diagnosis present

## 2019-09-11 DIAGNOSIS — H9190 Unspecified hearing loss, unspecified ear: Secondary | ICD-10-CM | POA: Insufficient documentation

## 2019-09-11 DIAGNOSIS — Z20822 Contact with and (suspected) exposure to covid-19: Secondary | ICD-10-CM | POA: Diagnosis not present

## 2019-09-11 DIAGNOSIS — E559 Vitamin D deficiency, unspecified: Secondary | ICD-10-CM | POA: Diagnosis not present

## 2019-09-11 DIAGNOSIS — E669 Obesity, unspecified: Secondary | ICD-10-CM | POA: Diagnosis not present

## 2019-09-11 DIAGNOSIS — R42 Dizziness and giddiness: Secondary | ICD-10-CM

## 2019-09-11 DIAGNOSIS — E079 Disorder of thyroid, unspecified: Secondary | ICD-10-CM | POA: Diagnosis present

## 2019-09-11 DIAGNOSIS — Z79899 Other long term (current) drug therapy: Secondary | ICD-10-CM | POA: Insufficient documentation

## 2019-09-11 DIAGNOSIS — D510 Vitamin B12 deficiency anemia due to intrinsic factor deficiency: Principal | ICD-10-CM | POA: Insufficient documentation

## 2019-09-11 DIAGNOSIS — Z6831 Body mass index (BMI) 31.0-31.9, adult: Secondary | ICD-10-CM | POA: Diagnosis not present

## 2019-09-11 DIAGNOSIS — R52 Pain, unspecified: Secondary | ICD-10-CM | POA: Diagnosis not present

## 2019-09-11 DIAGNOSIS — E0500 Thyrotoxicosis with diffuse goiter without thyrotoxic crisis or storm: Secondary | ICD-10-CM

## 2019-09-11 DIAGNOSIS — E538 Deficiency of other specified B group vitamins: Secondary | ICD-10-CM

## 2019-09-11 LAB — CBC
HCT: 18.2 % — ABNORMAL LOW (ref 36.0–46.0)
Hemoglobin: 6.5 g/dL — CL (ref 12.0–15.0)
MCH: 44.8 pg — ABNORMAL HIGH (ref 26.0–34.0)
MCHC: 35.7 g/dL (ref 30.0–36.0)
MCV: 125.5 fL — ABNORMAL HIGH (ref 80.0–100.0)
Platelets: 114 10*3/uL — ABNORMAL LOW (ref 150–400)
RBC: 1.45 MIL/uL — ABNORMAL LOW (ref 3.87–5.11)
RDW: 15.4 % (ref 11.5–15.5)
WBC: 5.3 10*3/uL (ref 4.0–10.5)
nRBC: 1.1 % — ABNORMAL HIGH (ref 0.0–0.2)

## 2019-09-11 LAB — CBC WITH DIFFERENTIAL/PLATELET
Basophils Absolute: 0 10*3/uL (ref 0.0–0.2)
Basos: 0 %
EOS (ABSOLUTE): 0.1 10*3/uL (ref 0.0–0.4)
Eos: 2 %
Hematocrit: 18.6 % — ABNORMAL LOW (ref 34.0–46.6)
Hemoglobin: 6.6 g/dL — CL (ref 11.1–15.9)
Lymphocytes Absolute: 2.4 10*3/uL (ref 0.7–3.1)
Lymphs: 43 %
MCH: 42.9 pg — ABNORMAL HIGH (ref 26.6–33.0)
MCHC: 35.5 g/dL (ref 31.5–35.7)
MCV: 121 fL — ABNORMAL HIGH (ref 79–97)
Monocytes Absolute: 0.2 10*3/uL (ref 0.1–0.9)
Monocytes: 4 %
Neutrophils Absolute: 2.9 10*3/uL (ref 1.4–7.0)
Neutrophils: 51 %
Platelets: 128 10*3/uL — ABNORMAL LOW (ref 150–450)
RBC: 1.54 x10E6/uL — CL (ref 3.77–5.28)
RDW: 18.3 % — ABNORMAL HIGH (ref 11.7–15.4)
WBC: 5.6 10*3/uL (ref 3.4–10.8)

## 2019-09-11 LAB — RETICULOCYTES
Immature Retic Fract: 23.8 % — ABNORMAL HIGH (ref 2.3–15.9)
RBC.: 1.45 MIL/uL — ABNORMAL LOW (ref 3.87–5.11)
Retic Count, Absolute: 33.9 10*3/uL (ref 19.0–186.0)
Retic Ct Pct: 2.3 % (ref 0.4–3.1)

## 2019-09-11 LAB — BASIC METABOLIC PANEL
BUN/Creatinine Ratio: 17 (ref 9–23)
BUN: 12 mg/dL (ref 6–24)
CO2: 29 mmol/L (ref 20–29)
Calcium: 9 mg/dL (ref 8.7–10.2)
Chloride: 99 mmol/L (ref 96–106)
Creatinine, Ser: 0.72 mg/dL (ref 0.57–1.00)
GFR calc Af Amer: 109 mL/min/{1.73_m2} (ref >59)
GFR calc non Af Amer: 95 mL/min/{1.73_m2} (ref >59)
Glucose: 98 mg/dL (ref 65–99)
Potassium: 3.9 mmol/L (ref 3.5–5.2)
Sodium: 135 mmol/L (ref 134–144)

## 2019-09-11 LAB — FERRITIN: Ferritin: 159 ng/mL (ref 11–307)

## 2019-09-11 LAB — IRON AND TIBC
Iron: 78 ug/dL (ref 28–170)
Saturation Ratios: 28 % (ref 10.4–31.8)
TIBC: 279 ug/dL (ref 250–450)
UIBC: 201 ug/dL

## 2019-09-11 LAB — VITAMIN B12: Vitamin B-12: <50 pg/mL — ABNORMAL LOW (ref 180–914)

## 2019-09-11 LAB — POC OCCULT BLOOD, ED: Fecal Occult Bld: NEGATIVE

## 2019-09-11 LAB — PREPARE RBC (CROSSMATCH)

## 2019-09-11 LAB — FOLATE: Folate: 17 ng/mL (ref >5.9)

## 2019-09-11 MED ORDER — PANTOPRAZOLE SODIUM 40 MG PO TBEC
40.0000 mg | DELAYED_RELEASE_TABLET | Freq: Every day | ORAL | Status: DC
  Administered 2019-09-12: 40 mg via ORAL
  Filled 2019-09-11: qty 1

## 2019-09-11 MED ORDER — ACETAMINOPHEN 325 MG PO TABS
650.0000 mg | ORAL_TABLET | Freq: Four times a day (QID) | ORAL | Status: DC | PRN
  Administered 2019-09-12: 650 mg via ORAL
  Filled 2019-09-11: qty 2

## 2019-09-11 MED ORDER — SODIUM CHLORIDE 0.9 % IV SOLN
10.0000 mL/h | Freq: Once | INTRAVENOUS | Status: AC
  Administered 2019-09-11: 10 mL/h via INTRAVENOUS

## 2019-09-11 MED ORDER — ACETAMINOPHEN 650 MG RE SUPP: 650.0000 mg | Freq: Four times a day (QID) | RECTAL | Status: DC | PRN

## 2019-09-11 MED ORDER — ONDANSETRON HCL 4 MG PO TABS
8.0000 mg | ORAL_TABLET | Freq: Three times a day (TID) | ORAL | Status: DC | PRN
  Administered 2019-09-12: 8 mg via ORAL
  Filled 2019-09-11: qty 2

## 2019-09-11 MED ORDER — CYANOCOBALAMIN 1000 MCG/ML IJ SOLN
1000.0000 ug | Freq: Once | INTRAMUSCULAR | Status: AC
  Administered 2019-09-11: 1000 ug via INTRAMUSCULAR
  Filled 2019-09-11: qty 1

## 2019-09-11 MED ORDER — ESTRADIOL 1 MG PO TABS
2.0000 mg | ORAL_TABLET | Freq: Every day | ORAL | Status: DC
  Administered 2019-09-12: 2 mg via ORAL
  Filled 2019-09-11: qty 2

## 2019-09-11 MED ORDER — SODIUM CHLORIDE 0.9 % IV BOLUS
1000.0000 mL | Freq: Once | INTRAVENOUS | Status: AC
  Administered 2019-09-11: 1000 mL via INTRAVENOUS

## 2019-09-11 MED ORDER — SODIUM CHLORIDE 0.9% FLUSH: 3.0000 mL | Freq: Two times a day (BID) | INTRAVENOUS | Status: DC

## 2019-09-11 MED ORDER — TRAZODONE HCL 50 MG PO TABS
25.0000 mg | ORAL_TABLET | Freq: Every evening | ORAL | Status: DC | PRN
  Administered 2019-09-12: 25 mg via ORAL
  Filled 2019-09-11: qty 1

## 2019-09-11 MED ORDER — SODIUM CHLORIDE 0.9% FLUSH: 3.0000 mL | INTRAVENOUS | Status: DC | PRN

## 2019-09-11 MED ORDER — SODIUM CHLORIDE 0.9 % IV SOLN: 250.0000 mL | INTRAVENOUS | Status: DC | PRN

## 2019-09-11 MED ORDER — LEVOTHYROXINE SODIUM 100 MCG PO TABS
100.0000 ug | ORAL_TABLET | Freq: Every day | ORAL | Status: DC
  Administered 2019-09-12: 100 ug via ORAL
  Filled 2019-09-11: qty 1

## 2019-09-11 NOTE — ED Triage Notes (Addendum)
Pt c/o dizziness and fatigue x 3 weeks, patient initially related symptoms to taking Dexilant, but has been off for 7 days with symptoms persisting. Pt also has history of anemia and hypothyroidism. Pt now c/o "cyst to my vagina"

## 2019-09-11 NOTE — ED Notes (Signed)
Date and time results received: 09/11/19 7:09 PM  (use smartphrase ".now" to insert current time)  Test: Hemoglobin Critical Value: 6.5  Name of Provider Notified: Dr.Steinl  Orders Received? Or Actions Taken?:

## 2019-09-11 NOTE — H&P (Signed)
History and Physical    [name] INO:[id number] DOB: 20-Jun-1965 DOA: 09/11/2019  PCP: Maurice Small, MD  Patient coming from: Home  I have personally briefly reviewed patient's old medical records in [facility]  Chief Complaint: Weakness   HPI: [name] is a 54 y.o. female with medical history significant of Graves' disease, GERD, hearing loss.  She is status post hysterectomy in 2005.  She reports chronic anemia in the past and has not been able to tolerate iron regularly.  Reports that she last tried to give blood in February and was noted to have a hemoglobin of 11.5 at that time.  She was unable to donate.  She also has a hemoglobin in Care Everywhere of 12.6 in December of last year.  She reports an approximate 69-month history of worsening acid reflux, which caused her to increase her Dexilant.  She has nausea constipation and just generalized weakness ongoing since that time, and was blaming it on the medicine.  She continued to get worse and noted head spinning that became worse with walking, movement, or changes in position.  She complains of fatigue, feeling exhausted, nausea without vomiting.  She denies melena, hematochezia, hematuria, and hematemesis. ED Course: She is found to have hemoglobin of 6.5 a B12 level of less than 50, and an MCV of 121.  Her iron studies are normal as is her folate.  Her Hemoccult is negative.  We are asked to admit to complete transfusion.  She was given 1 dose of B12 in the ED.  Review of Systems: As per HPI otherwise 10 point review of systems negative.   Past Medical History:  Diagnosis Date  . Anemia    chronic anemia, does not tolerate iron  . Arthritis   . Cellulitis    in past with surgery, and occassional swelling of feet and legs in past  . GERD (gastroesophageal reflux disease)   . Headache    occasional migraines  . Influenza-like symptoms    07-24-14 "fever, cough, body aches" started Tamiflu-has had 2 doses, now  afebrile; cough remains productive with some chest congestion-mucus varies yellow to tan-is feeling better after Tamiflu started. pt will make Dr. Randa Evens aware.  . Menometrorrhagia 04/25/2000   Right parotubal cyst x 2.  . Palpitation    caused by high thyroid in past  . Phlebitis     Past Surgical History:  Procedure Laterality Date  . BUNIONECTOMY Bilateral   . ESOPHAGOGASTRODUODENOSCOPY (EGD) WITH PROPOFOL N/A 08/05/2014   Procedure: ESOPHAGOGASTRODUODENOSCOPY (EGD) WITH PROPOFOL;  Surgeon: Carman Ching, MD;  Location: [facility];  Service: Endoscopy;  Laterality: N/A;  . thumb surgery Left   . VAGINAL HYSTERECTOMY     removal of paratubal cyst.     reports that she has never smoked. She has never used smokeless tobacco. She reports that she does not drink alcohol and does not use drugs.  Allergies  Allergen Reactions  . Penicillins     Cant remember  Did it involve swelling of the face/tongue/throat, SOB, or low BP? N Did it involve sudden or severe rash/hives, skin peeling, or any reaction on the inside of your mouth or nose? Y Did you need to seek medical attention at a hospital or doctor's office? No When did it last happen?Baby If all above answers are "NO", may proceed with cephalosporin use.    . Sulfa Drugs Cross Reactors Nausea And Vomiting    Hives,rash with topicals    History reviewed.  No pertinent family history.   Prior to Admission medications   Medication Sig Start Date End Date Taking? Authorizing Provider  dexlansoprazole (DEXILANT) 60 MG capsule Take 60 mg by mouth daily as needed (acid reflux).    Yes [provider]  estradiol (ESTRACE) 2 MG tablet Take 2 mg by mouth daily.     Yes [provider]  levothyroxine (SYNTHROID, LEVOTHROID) 100 MCG tablet Take 100 mcg by mouth daily.     Yes [provider]  ondansetron (ZOFRAN) 8 MG tablet Take 8 mg by mouth every 8 (eight) hours as needed for nausea or vomiting.   08/21/19  Yes [provider]    Physical Exam: Constitutional: NAD, calm, comfortable Vitals:   09/11/19 2230 09/11/19 2300 09/12/19 0004 09/12/19 0041  BP: (!) 136/59 (!) 128/48 (!) 125/54 121/70  Pulse: 81 74 70 65  Resp: 16 18 18 18   Temp:    98.7 F (37.1 C)  TempSrc:    Oral  SpO2: 99% 99% 100% 100%  Weight:      Height:       Eyes: PERRL, lids and conjunctivae normal ENMT: Mucous membranes are moist. Posterior pharynx clear of any exudate or lesions.Normal dentition.  Neck: normal, supple, no masses, no thyromegaly Respiratory: clear to auscultation bilaterally, no wheezing, no crackles. Normal respiratory effort. No accessory muscle use.  Cardiovascular: Regular rate and rhythm, no murmurs / rubs / gallops. No extremity edema. 2+ pedal pulses. No carotid bruits.  Abdomen: no tenderness, no masses palpated. No hepatosplenomegaly. Bowel sounds positive.  Musculoskeletal: no clubbing / cyanosis. No joint deformity upper and lower extremities. Good ROM, no contractures. Normal muscle tone.  Skin: no rashes, lesions, ulcers. No induration Neurologic: CN 2-12 grossly intact. Sensation intact, DTR normal. Strength 5/5 in all 4.  Psychiatric: Normal judgment and insight. Alert and oriented x 3. Normal mood.   Labs on Admission: I have personally reviewed following labs and imaging studies  CBC: Recent Labs  Lab 09/11/19 1506 09/11/19 1816  WBC 5.6 5.3  NEUTROABS 2.9  --   HGB 6.6* 6.5*  HCT 18.6* 18.2*  MCV 121* 125.5*  PLT 128* 974*   Basic Metabolic Panel: Recent Labs  Lab 09/11/19 1506  NA 135  K 3.9  CL 99  CO2 29  GLUCOSE 98  BUN 12  CREATININE 0.72  CALCIUM 9.0   Anemia Panel: Recent Labs    09/11/19 1816  VITAMINB12 <50*  FOLATE 17.0  FERRITIN 159  TIBC 279  IRON 78  RETICCTPCT 2.3     Assessment/Plan Principal Problem:   Vitamin B12 deficiency anemia Active Problems:   Gastroesophageal reflux disease   Graves disease   Obesity  (BMI 30.0-34.9)   Thyroid dysfunction   Vitamin B 12 deficiency   B12 deficiency anemia  Vitamin B12 deficient anemia: Hemoglobin of 6.5.  Given ongoing symptoms, she will be transfused 2 units of packed red blood cells which were started in the ER.  B12 deficiency: B12 level of less than 50.  1 shot of B12 was given in the ER.  This will need to be continued monthly  GERD: She has associated sore throat and hoarseness.  She has been on Dexilant which she is intolerant of, will try Protonix here.  Graves' disease: She is status post radioactive iodine and subsequent hypothyroidism.  We will continue Synthroid at 100 mcg daily.  Obesity: Could benefit from healthy lifestyle  DVT prophylaxis: SCD/Compression stockings Code Status: Full code  Family  Communication: Husband at bedside Disposition Plan: Home.  Patient is quite anxious to return home as quickly as possible. Consults called: None Admission status: Observation for blood transfusion   Reva Bores MD [facility]  If 7PM-7AM, please contact night-coverage 09/12/2019, 1:30 AM

## 2019-09-11 NOTE — Discharge Instructions (Signed)
Covid testing ordered, please quarantine until testing results return. CBC/BMP drawn as well for further evaluation. Orthostatic positive, which indicates dehydration and could be contributing to your symptoms. We provided IV fluids today, continue hydration. Follow up with PCP for further evaluation if symptoms not improving. If having worsening symptoms, chest pain, shortness of breath, passing out go to the ED for further evaluation.

## 2019-09-11 NOTE — ED Triage Notes (Signed)
Pt from urgent care who was sent to ER for low hgb. Pt reports UC reported a hgb of 6.6. Pt c/o dizziness, nausea, lack of energy, fatigue, racing heart and orthostatic changes. Pt denies pain and dark stools.

## 2019-09-11 NOTE — ED Provider Notes (Signed)
[facility]    CSN: [id number] Arrival date & time: 09/11/19  1349      History   Chief Complaint Chief Complaint  Patient presents with  . Fatigue  . Dizziness    HPI [name] is a 54 y.o. female.   54 year old female with history of anemia, hypothyroidism, Mnire's comes in for 3 week history of dizziness, fatigue. Describes as head spinning that is constant, worse with walking/movement/changes in position. Symptoms improves with sitting. No obvious changes with head movement. Denies URI symptoms. No fevers, but feels hot to the head/neck. Denies urinary symptoms. Denies abdominal pain. Has constipation. Feels muscle fatigue to the upper extremities/thoracic back. Feels exhausted. Nausea without vomiting. Denies melena, hematochezia, hematuria.   Colonoscopies every 5 years due to family history, up to date.      Past Medical History:  Diagnosis Date  . Anemia    chronic anemia, does not tolerate iron  . Arthritis   . Cellulitis    in past with surgery, and occassional swelling of feet and legs in past  . GERD (gastroesophageal reflux disease)   . Headache    occasional migraines  . Influenza-like symptoms    07-24-14 "fever, cough, body aches" started Tamiflu-has had 2 doses, now afebrile; cough remains productive with some chest congestion-mucus varies yellow to tan-is feeling better after Tamiflu started. pt will make Dr. Oletta Lamas aware.  . Menometrorrhagia 04/25/2000   Right parotubal cyst x 2.  . Palpitation    caused by high thyroid in past  . Phlebitis     Patient Active Problem List   Diagnosis Date Noted  . Heart palpitations 08/30/2010    Past Surgical History:  Procedure Laterality Date  . BUNIONECTOMY Bilateral   . ESOPHAGOGASTRODUODENOSCOPY (EGD) WITH PROPOFOL N/A 08/05/2014   Procedure: ESOPHAGOGASTRODUODENOSCOPY (EGD) WITH PROPOFOL;  Surgeon: Laurence Spates, MD;  Location: [facility];  Service: Endoscopy;  Laterality: N/A;  .  thumb surgery Left   . VAGINAL HYSTERECTOMY     removal of paratubal cyst.    OB History   No obstetric history on file.      Home Medications    Prior to Admission medications   Medication Sig Start Date End Date Taking? Authorizing Provider  dexlansoprazole (DEXILANT) 60 MG capsule Take 60 mg by mouth daily.    [provider]  estradiol (ESTRACE) 2 MG tablet Take 2 mg by mouth daily.      [provider]  levothyroxine (SYNTHROID, LEVOTHROID) 100 MCG tablet Take 100 mcg by mouth daily.      [provider]  naproxen sodium (ANAPROX) 220 MG tablet Take 220 mg by mouth 2 (two) times daily with a meal.    [provider]    Family History History reviewed. No pertinent family history.  Social History Social History   Tobacco Use  . Smoking status: Never Smoker  . Smokeless tobacco: Never Used  Substance Use Topics  . Alcohol use: No  . Drug use: No     Allergies   Penicillins and Sulfa drugs cross reactors   Review of Systems Review of Systems  Reason unable to perform ROS: See HPI as above.     Physical Exam Triage Vital Signs ED Triage Vitals [09/11/19 1357]  Enc Vitals Group     BP 140/75     Pulse Rate 96     Resp 16     Temp 98.1 F (36.7 C)     Temp  src      SpO2 98 %     Weight      Height      Head Circumference      Peak Flow      Pain Score 0     Pain Loc      Pain Edu?      Excl. in GC?    Orthostatic VS for the past 24 hrs:  BP- Lying Pulse- Lying BP- Standing at 0 minutes Pulse- Standing at 0 minutes  09/11/19 1402 134/74 99 106/46 116    Updated Vital Signs BP 140/75   Pulse 96   Temp 98.1 F (36.7 C)   Resp 16   SpO2 98%   Physical Exam Constitutional:      General: She is not in acute distress.    Appearance: Normal appearance. She is well-developed. She is not toxic-appearing or diaphoretic.  HENT:     Head: Normocephalic and atraumatic.     Mouth/Throat:     Mouth: Mucous  membranes are dry.     Pharynx: Oropharynx is clear. Uvula midline.  Eyes:     Conjunctiva/sclera: Conjunctivae normal.     Pupils: Pupils are equal, round, and reactive to light.  Cardiovascular:     Rate and Rhythm: Normal rate and regular rhythm.     Heart sounds: No murmur heard.  No friction rub. No gallop.   Pulmonary:     Effort: Pulmonary effort is normal. No respiratory distress.     Comments: Speaking in full sentences without difficulty. LCTAB Abdominal:     General: Bowel sounds are normal.     Palpations: Abdomen is soft.     Tenderness: There is no abdominal tenderness. There is no guarding or rebound.  Musculoskeletal:     Cervical back: Normal range of motion and neck supple.  Skin:    General: Skin is warm and dry.  Neurological:     Mental Status: She is alert and oriented to person, place, and time.     UC Treatments / Results  Labs (all labs ordered are listed, but only abnormal results are displayed) Labs Reviewed  CBC WITH DIFFERENTIAL/PLATELET - Abnormal; Notable for the following components:      Result Value   RBC 1.54 (*)    Hemoglobin 6.6 (*)    Hematocrit 18.6 (*)    MCV 121 (*)    MCH 42.9 (*)    RDW 18.3 (*)    Platelets 128 (*)    All other components within normal limits   Narrative:    Performed at:  01 - [facility] [address]  [address], [place], [place]  [id number] Lab Director: Mila Homer MD, Phone:  [phone]  NOVEL CORONAVIRUS, NAA  BASIC METABOLIC PANEL   Narrative:    Performed at:  39 - [facility] [address]  [address], [place], [place]  [id number] Lab Director: Mila Homer MD, Phone:  [phone]    EKG   Radiology No results found.  Procedures Procedures (including critical care time)  Medications Ordered in UC Medications  sodium chloride 0.9 % bolus 1,000 mL (0 mLs Intravenous Stopped 09/11/19 1615)    Initial Impression / Assessment and Plan / UC Course  I have  reviewed the triage vital signs and the nursing notes.  Pertinent labs & imaging results that were available during my care of the patient were reviewed by me and considered in my medical decision making (see chart  for details).    Covid testing ordered, patient to quarantine until testing results return.  Triage vitals were stable, orthostatic positive.  During exam, no tachycardia.  Will to ambulate on own without difficulty.  Will provide IV fluids, draw CBC/BMP for further evaluation.  Patient with some improvement in symptoms after 500 mL of IV fluids, and requested to discontinue.  Patient discharged in stable condition pending lab results.  CBC shows hemoglobin of 6.6.  No comparison in epic for review.  Patient does have history of anemia.  However, given symptomatic anemia, will have patient follow-up at the ED for further evaluation and management needed.  Called patient by lab results, discussed ED follow-up, for which patient expressed understanding and agrees to plan.  Final Clinical Impressions(s) / UC Diagnoses   Final diagnoses:  Body aches  Fatigue, unspecified type  Dizziness and giddiness    ED Prescriptions    None     PDMP not reviewed this encounter.   Belinda Fisher, PA-C 09/11/19 1653

## 2019-09-11 NOTE — ED Provider Notes (Addendum)
[facility]-EMERGENCY DEPT Provider Note   CSN: [id number] Arrival date & time: 09/11/19  1737     History Chief Complaint  Patient presents with  . Near Syncope  . Weakness  . Fatigue  . Anemia    Milderd A Deleeuw is a 54 y.o. female.  Patient c/o being sent to ED by urgent care where she was found to be severely anemic with hemoglobin 6.6.  Patient notes general weakness, lightheaded when stands, fatigue, progressively worsening in past few months, and reports feeling worse in past week. Denies any blood loss - hx hysterectomy, no vaginal bleeding, no rectal bleeding or melena (states stools very light in color), no hematuria, no epistaxis. Denies abd or flank pain. States hx low iron, but has never been able to tolerate po iron due to nausea.   The history is provided by the patient.  Abnormal Lab      Past Medical History:  Diagnosis Date  . Anemia    chronic anemia, does not tolerate iron  . Arthritis   . Cellulitis    in past with surgery, and occassional swelling of feet and legs in past  . GERD (gastroesophageal reflux disease)   . Headache    occasional migraines  . Influenza-like symptoms    07-24-14 "fever, cough, body aches" started Tamiflu-has had 2 doses, now afebrile; cough remains productive with some chest congestion-mucus varies yellow to tan-is feeling better after Tamiflu started. pt will make Dr. Randa Evens aware.  . Menometrorrhagia 04/25/2000   Right parotubal cyst x 2.  . Palpitation    caused by high thyroid in past  . Phlebitis     Patient Active Problem List   Diagnosis Date Noted  . Heart palpitations 08/30/2010    Past Surgical History:  Procedure Laterality Date  . BUNIONECTOMY Bilateral   . ESOPHAGOGASTRODUODENOSCOPY (EGD) WITH PROPOFOL N/A 08/05/2014   Procedure: ESOPHAGOGASTRODUODENOSCOPY (EGD) WITH PROPOFOL;  Surgeon: Carman Ching, MD;  Location: [facility];  Service: Endoscopy;  Laterality: N/A;  . thumb surgery  Left   . VAGINAL HYSTERECTOMY     removal of paratubal cyst.     OB History   No obstetric history on file.     History reviewed. No pertinent family history.  Social History   Tobacco Use  . Smoking status: Never Smoker  . Smokeless tobacco: Never Used  Substance Use Topics  . Alcohol use: No  . Drug use: No    Home Medications Prior to Admission medications   Medication Sig Start Date End Date Taking? Authorizing Provider  dexlansoprazole (DEXILANT) 60 MG capsule Take 60 mg by mouth daily as needed (acid reflux).    Yes [provider]  estradiol (ESTRACE) 2 MG tablet Take 2 mg by mouth daily.     Yes [provider]  levothyroxine (SYNTHROID, LEVOTHROID) 100 MCG tablet Take 100 mcg by mouth daily.     Yes [provider]  ondansetron (ZOFRAN) 8 MG tablet Take 8 mg by mouth every 8 (eight) hours as needed for nausea or vomiting.  08/21/19  Yes [provider]    Allergies    Penicillins and Sulfa drugs cross reactors  Review of Systems   Review of Systems  Constitutional: Negative for chills and fever.  HENT: Negative for nosebleeds.   Eyes: Negative for redness.  Respiratory: Negative for shortness of breath.   Cardiovascular: Negative for chest pain.  Gastrointestinal: Negative for abdominal pain, blood in stool and vomiting.  Genitourinary: Negative  for flank pain and hematuria.  Musculoskeletal: Negative for back pain.  Skin: Negative for rash.  Neurological: Positive for light-headedness. Negative for headaches.  Hematological: Does not bruise/bleed easily.  Psychiatric/Behavioral: Negative for confusion.    Physical Exam Updated Vital Signs BP (!) 130/55   Pulse 87   Temp 98.8 F (37.1 C)   Resp (!) 24   Ht 1.676 m (5\' 6" )   Wt 88 kg   SpO2 98%   BMI 31.31 kg/m   Physical Exam Vitals and nursing note reviewed.  Constitutional:      Appearance: Normal appearance. She is well-developed.  HENT:     Head:  Atraumatic.     Nose: Nose normal.     Mouth/Throat:     Mouth: Mucous membranes are moist.  Eyes:     General: No scleral icterus.    Comments: Conjunctiva pale.   Neck:     Trachea: No tracheal deviation.  Cardiovascular:     Rate and Rhythm: Normal rate and regular rhythm.     Pulses: Normal pulses.     Heart sounds: Normal heart sounds. No murmur heard.  No friction rub. No gallop.   Pulmonary:     Effort: Pulmonary effort is normal. No respiratory distress.     Breath sounds: Normal breath sounds.  Abdominal:     General: Bowel sounds are normal. There is no distension.     Palpations: Abdomen is soft.     Tenderness: There is no abdominal tenderness. There is no guarding.  Genitourinary:    Comments: No cva tenderness.  Musculoskeletal:        General: No swelling.     Cervical back: Normal range of motion and neck supple. No rigidity. No muscular tenderness.  Skin:    General: Skin is warm and dry.     Coloration: Skin is pale.     Findings: No rash.  Neurological:     Mental Status: She is alert.     Comments: Alert, speech normal.   Psychiatric:        Mood and Affect: Mood normal.     ED Results / Procedures / Treatments   Labs (all labs ordered are listed, but only abnormal results are displayed) Results for orders placed or performed during the hospital encounter of 09/11/19  Vitamin B12  Result Value Ref Range   Vitamin B-12 <50 (L) 180 - 914 pg/mL  Folate  Result Value Ref Range   Folate 17.0 >5.9 ng/mL  Iron and TIBC  Result Value Ref Range   Iron 78 28 - 170 ug/dL   TIBC 279 250 - 450 ug/dL   Saturation Ratios 28 10.4 - 31.8 %   UIBC 201 ug/dL  Ferritin  Result Value Ref Range   Ferritin 159 11 - 307 ng/mL  Reticulocytes  Result Value Ref Range   Retic Ct Pct 2.3 0.4 - 3.1 %   RBC. 1.45 (L) 3.87 - 5.11 MIL/uL   Retic Count, Absolute 33.9 19.0 - 186.0 K/uL   Immature Retic Fract 23.8 (H) 2.3 - 15.9 %  CBC  Result Value Ref Range   WBC  5.3 4.0 - 10.5 K/uL   RBC 1.45 (L) 3.87 - 5.11 MIL/uL   Hemoglobin 6.5 (LL) 12.0 - 15.0 g/dL   HCT 18.2 (L) 36 - 46 %   MCV 125.5 (H) 80.0 - 100.0 fL   MCH 44.8 (H) 26.0 - 34.0 pg   MCHC 35.7 30.0 - 36.0 g/dL  RDW 15.4 11.5 - 15.5 %   Platelets 114 (L) 150 - 400 K/uL   nRBC 1.1 (H) 0.0 - 0.2 %  POC occult blood, ED  Result Value Ref Range   Fecal Occult Bld NEGATIVE NEGATIVE  Type and screen  Result Value Ref Range   ABO/RH(D) O POS    Antibody Screen      NEG Performed at [facility], [address] [place], [place] [address]    Sample Expiration PENDING     EKG None  Radiology No results found.  Procedures Procedures (including critical care time)  Medications Ordered in ED Medications  cyanocobalamin ((VITAMIN B-12)) injection 1,000 mcg (has no administration in time range)  0.9 %  sodium chloride infusion (10 mL/hr Intravenous New Bag/Given 09/11/19 1959)    ED Course  I have reviewed the triage vital signs and the nursing notes.  Pertinent labs & imaging results that were available during my care of the patient were reviewed by me and considered in my medical decision making (see chart for details).    MDM Rules/Calculators/A&P                          Iv ns. Stat labs. Continuous pulse ox and monitor.   Patient w near syncope upon standing.  MDM Number of Diagnoses or Management Options   Amount and/or Complexity of Data Reviewed Clinical lab tests: ordered and reviewed Tests in the medicine section of CPT: ordered and reviewed Decide to obtain previous medical records or to obtain history from someone other than the patient: yes Obtain history from someone other than the patient: yes Review and summarize past medical records: yes Discuss the patient with other providers: yes Independent visualization of images, tracings, or specimens: yes  Risk of Complications, Morbidity, and/or Mortality Presenting problems:  high Diagnostic procedures: high Management options: high     Reviewed nursing notes and prior charts for additional history.   Labs reviewed/interpreted by me - hgb very low, 6.5.  Patient w symptomatic anemia, will transfuse 2 units prbc. Discussed w pt - pt agreeable.  Will consult hospitalists for severe, symptomatic anemia, in need of transfusion.  Additional labs reviewed/interpreted by me - stool heme neg.   Additional labs reviewed/interpreted by me - vit b 12 v low.  Supplement given.   CRITICAL CARE RE: severe, symptomatic anemia requiring emergent prbc tranfusion.  Performed by: Suzi Roots Total critical care time: 35 minutes Critical care time was exclusive of separately billable procedures and treating other patients. Critical care was necessary to treat or prevent imminent or life-threatening deterioration. Critical care was time spent personally by me on the following activities: development of treatment plan with patient and/or surrogate as well as nursing, discussions with consultants, evaluation of patient's response to treatment, examination of patient, obtaining history from patient or surrogate, ordering and performing treatments and interventions, ordering and review of laboratory studies, ordering and review of radiographic studies, pulse oximetry and re-evaluation of patient's condition.    Final Clinical Impression(s) / ED Diagnoses Final diagnoses:  Symptomatic anemia  Near syncope  Generalized weakness  Other fatigue  Vitamin B12 deficiency    Rx / DC Orders ED Discharge Orders    None           Cathren Laine, MD 09/11/19 2006

## 2019-09-12 DIAGNOSIS — D519 Vitamin B12 deficiency anemia, unspecified: Secondary | ICD-10-CM

## 2019-09-12 LAB — T4, FREE: Free T4: 1.16 ng/dL — ABNORMAL HIGH (ref 0.61–1.12)

## 2019-09-12 LAB — CBC
HCT: 26.2 % — ABNORMAL LOW (ref 36.0–46.0)
Hemoglobin: 9.2 g/dL — ABNORMAL LOW (ref 12.0–15.0)
MCH: 38.3 pg — ABNORMAL HIGH (ref 26.0–34.0)
MCHC: 35.1 g/dL (ref 30.0–36.0)
MCV: 109.2 fL — ABNORMAL HIGH (ref 80.0–100.0)
Platelets: 98 10*3/uL — ABNORMAL LOW (ref 150–400)
RBC: 2.4 MIL/uL — ABNORMAL LOW (ref 3.87–5.11)
WBC: 4.8 10*3/uL (ref 4.0–10.5)
nRBC: 0.8 % — ABNORMAL HIGH (ref 0.0–0.2)

## 2019-09-12 LAB — NOVEL CORONAVIRUS, NAA: SARS-CoV-2, NAA: NOT DETECTED

## 2019-09-12 LAB — VITAMIN D 25 HYDROXY (VIT D DEFICIENCY, FRACTURES): Vit D, 25-Hydroxy: 21.75 ng/mL — ABNORMAL LOW (ref 30–100)

## 2019-09-12 LAB — HEMOGLOBIN AND HEMATOCRIT, BLOOD
HCT: 25.4 % — ABNORMAL LOW (ref 36.0–46.0)
Hemoglobin: 9 g/dL — ABNORMAL LOW (ref 12.0–15.0)

## 2019-09-12 LAB — TSH: TSH: 0.533 u[IU]/mL (ref 0.350–4.500)

## 2019-09-12 LAB — ABO/RH: ABO/RH(D): O POS

## 2019-09-12 LAB — HIV ANTIBODY (ROUTINE TESTING W REFLEX): HIV Screen 4th Generation wRfx: NONREACTIVE

## 2019-09-12 LAB — SARS-COV-2, NAA 2 DAY TAT

## 2019-09-12 MED ORDER — LEVOTHYROXINE SODIUM 88 MCG PO TABS: 88.0000 ug | ORAL_TABLET | Freq: Every day | ORAL | 1 refills | Status: AC

## 2019-09-12 MED ORDER — MECLIZINE HCL 25 MG PO TABS
12.5000 mg | ORAL_TABLET | Freq: Two times a day (BID) | ORAL | Status: DC | PRN
  Administered 2019-09-12: 12.5 mg via ORAL
  Filled 2019-09-12: qty 1

## 2019-09-12 MED ORDER — VITAMIN B-12 1000 MCG PO TABS: 1000.0000 ug | ORAL_TABLET | Freq: Every day | ORAL | 1 refills | Status: AC

## 2019-09-12 NOTE — ED Notes (Signed)
ED TO INPATIENT HANDOFF REPORT  ED Nurse Name and Phone #: Marchelle Folks [phone]  S Name/Age/Gender [name] 54 y.o. female Room/Bed: WOTF/NONE  Code Status   Code Status: Full Code  Home/SNF/Other Home Patient oriented to: self, place, time and situation Is this baseline? Yes   Triage Complete: Triage complete  Chief Complaint B12 deficiency anemia [D51.9]  Triage Note Pt from urgent care who was sent to ER for low hgb. Pt reports UC reported a hgb of 6.6. Pt c/o dizziness, nausea, lack of energy, fatigue, racing heart and orthostatic changes. Pt denies pain and dark stools.     Allergies Allergies  Allergen Reactions  . Penicillins     Cant remember  Did it involve swelling of the face/tongue/throat, SOB, or low BP? N Did it involve sudden or severe rash/hives, skin peeling, or any reaction on the inside of your mouth or nose? Y Did you need to seek medical attention at a hospital or doctor's office? No When did it last happen?Baby If all above answers are "NO", may proceed with cephalosporin use.    . Sulfa Drugs Cross Reactors Nausea And Vomiting    Hives,rash with topicals    Level of Care/Admitting Diagnosis ED Disposition    ED Disposition Condition Comment   Admit  Hospital Area: [facility] [[id number]]  Level of Care: Med-Surg [16]  Covid Evaluation: Asymptomatic Screening Protocol (No Symptoms)  Diagnosis: B12 deficiency anemia [520802]  Admitting Physician: Samara Snide  Attending Physician: Samara Snide       B Medical/Surgery History Past Medical History:  Diagnosis Date  . Anemia    chronic anemia, does not tolerate iron  . Arthritis   . Cellulitis    in past with surgery, and occassional swelling of feet and legs in past  . GERD (gastroesophageal reflux disease)   . Headache    occasional migraines  . Influenza-like symptoms    07-24-14 "fever, cough, body aches" started Tamiflu-has had 2  doses, now afebrile; cough remains productive with some chest congestion-mucus varies yellow to tan-is feeling better after Tamiflu started. pt will make Dr. Randa Evens aware.  . Menometrorrhagia 04/25/2000   Right parotubal cyst x 2.  . Palpitation    caused by high thyroid in past  . Phlebitis    Past Surgical History:  Procedure Laterality Date  . BUNIONECTOMY Bilateral   . ESOPHAGOGASTRODUODENOSCOPY (EGD) WITH PROPOFOL N/A 08/05/2014   Procedure: ESOPHAGOGASTRODUODENOSCOPY (EGD) WITH PROPOFOL;  Surgeon: Carman Ching, MD;  Location: [facility];  Service: Endoscopy;  Laterality: N/A;  . thumb surgery Left   . VAGINAL HYSTERECTOMY     removal of paratubal cyst.     A IV Location/Drains/Wounds Patient Lines/Drains/Airways Status    Active Line/Drains/Airways    Name Placement date Placement time Site Days   Peripheral IV 09/11/19 Right Antecubital 09/11/19  1808  Antecubital  1   Peripheral IV 09/11/19 Left Antecubital 09/11/19  1959  Antecubital  1          Intake/Output Last 24 hours  Intake/Output Summary (Last 24 hours) at 09/12/2019 1220 Last data filed at 09/12/2019 0433 Gross per 24 hour  Intake 1685.54 ml  Output --  Net 1685.54 ml    Labs/Imaging Results for orders placed or performed during the hospital encounter of 09/11/19 (from the past 48 hour(s))  Type and screen     Status: None (Preliminary result)   Collection Time: 09/11/19  6:15 PM  Result Value  Ref Range   ABO/RH(D) O POS    Antibody Screen NEG    Sample Expiration 09/14/2019,2359    Unit Number [id number]    Blood Component Type RED CELLS,LR    Unit division 00    Status of Unit ISSUED,FINAL    Transfusion Status OK TO TRANSFUSE    Crossmatch Result      Compatible Performed at [place] [address], [place], [place] [address]    Unit Number [id number]    Blood Component Type RED CELLS,LR    Unit division 00    Status of Unit ISSUED    Transfusion Status OK  TO TRANSFUSE    Crossmatch Result Compatible   Vitamin B12     Status: Abnormal   Collection Time: 09/11/19  6:16 PM  Result Value Ref Range   Vitamin B-12 <50 (L) 180 - 914 pg/mL    Comment: (NOTE) This assay is not validated for testing neonatal or myeloproliferative syndrome specimens for Vitamin B12 levels. Performed at [facility], [place] [address] [place], [place] [address]   Folate     Status: None   Collection Time: 09/11/19  6:16 PM  Result Value Ref Range   Folate 17.0 >5.9 ng/mL    Comment: Performed at [facility], [place] [address] [place], [place] [address]  Iron and TIBC     Status: None   Collection Time: 09/11/19  6:16 PM  Result Value Ref Range   Iron 78 28 - 170 ug/dL   TIBC 279 250 - 450 ug/dL   Saturation Ratios 28 10.4 - 31.8 %   UIBC 201 ug/dL    Comment: Performed at [facility], [place] [address] [place], [place] [address]  Ferritin     Status: None   Collection Time: 09/11/19  6:16 PM  Result Value Ref Range   Ferritin 159 11 - 307 ng/mL    Comment: Performed at [facility], [place] [address], [place], [place] [address]  Reticulocytes     Status: Abnormal   Collection Time: 09/11/19  6:16 PM  Result Value Ref Range   Retic Ct Pct 2.3 0.4 - 3.1 %   RBC. 1.45 (L) 3.87 - 5.11 MIL/uL   Retic Count, Absolute 33.9 19.0 - 186.0 K/uL   Immature Retic Fract 23.8 (H) 2.3 - 15.9 %    Comment: Performed at [facility], [place] [address] [place], [place] [address]  CBC     Status: Abnormal   Collection Time: 09/11/19  6:16 PM  Result Value Ref Range   WBC 5.3 4.0 - 10.5 K/uL   RBC 1.45 (L) 3.87 - 5.11 MIL/uL   Hemoglobin 6.5 (LL) 12.0 - 15.0 g/dL    Comment: REPEATED TO VERIFY THIS CRITICAL RESULT HAS VERIFIED AND BEEN CALLED TO [facility] BY NATHAN THOMPSON ON 06 11 2021 AT 1906, AND HAS BEEN READ BACK. CRITICAL RESULT VERIFIED    HCT 18.2 (L) 36 - 46 %   MCV 125.5 (H)  80.0 - 100.0 fL   MCH 44.8 (H) 26.0 - 34.0 pg   MCHC 35.7 30.0 - 36.0 g/dL   RDW 15.4 11.5 - 15.5 %   Platelets 114 (L) 150 - 400 K/uL    Comment: SPECIMEN CHECKED FOR CLOTS Immature Platelet Fraction may be clinically indicated, consider ordering this additional test [id number] PLATELET COUNT CONFIRMED BY SMEAR REPEATED TO VERIFY    nRBC 1.1 (H) 0.0 - 0.2 %  Comment: Performed at [facility], [address] [place], [place] [address]  ABO/Rh     Status: None   Collection Time: 09/11/19  6:16 PM  Result Value Ref Range   ABO/RH(D)      O POS Performed at [facility], [address], [place], [place] [address]   POC occult blood, ED     Status: None   Collection Time: 09/11/19  7:02 PM  Result Value Ref Range   Fecal Occult Bld NEGATIVE NEGATIVE  Prepare RBC (crossmatch)     Status: None   Collection Time: 09/11/19  8:00 PM  Result Value Ref Range   Order Confirmation      ORDER PROCESSED BY BLOOD BANK Performed at [facility], [address], [place], [place] [address]   HIV Antibody (routine testing w rflx)     Status: None   Collection Time: 09/11/19 10:09 PM  Result Value Ref Range   HIV Screen 4th Generation wRfx Non Reactive Non Reactive    Comment: Performed at [facility], [address], [place], [place] [address]  CBC     Status: Abnormal   Collection Time: 09/12/19  5:49 AM  Result Value Ref Range   WBC 4.8 4.0 - 10.5 K/uL   RBC 2.40 (L) 3.87 - 5.11 MIL/uL   Hemoglobin 9.2 (L) 12.0 - 15.0 g/dL    Comment: REPEATED TO VERIFY POST TRANSFUSION SPECIMEN    HCT 26.2 (L) 36 - 46 %   MCV 109.2 (H) 80.0 - 100.0 fL   MCH 38.3 (H) 26.0 - 34.0 pg   MCHC 35.1 30.0 - 36.0 g/dL   RDW Not Measured 91.5 - 15.5 %   Platelets 98 (L) 150 - 400 K/uL    Comment: SPECIMEN CHECKED FOR CLOTS Immature Platelet Fraction may be clinically indicated, consider ordering this additional test [id number] PLATELET  COUNT CONFIRMED BY SMEAR REPEATED TO VERIFY    nRBC 0.8 (H) 0.0 - 0.2 %    Comment: Performed at [facility], [address], [place], [place] [address]  Pathologist smear review     Status: None   Collection Time: 09/12/19  8:30 AM  Result Value Ref Range   Path Review SMEAR STAINED AND AVAILABLE FOR REVIEW     Comment: Performed at [facility], [address], [place], [place] [address]  TSH     Status: None   Collection Time: 09/12/19  9:01 AM  Result Value Ref Range   TSH 0.533 0.350 - 4.500 uIU/mL    Comment: Performed by a 3rd Generation assay with a functional sensitivity of <=0.01 uIU/mL. Performed at [facility], [address] [place], [place] [address]   Hemoglobin and hematocrit, blood     Status: Abnormal   Collection Time: 09/12/19 10:49 AM  Result Value Ref Range   Hemoglobin 9.0 (L) 12.0 - 15.0 g/dL   HCT 75.4 (L) 36 - 46 %    Comment: Performed at [facility], [address], [place], [place] [address]   No results found.  Pending Labs [employer] (From admission, onward) Comment          Start     Ordered   09/12/19 0829  Intrinsic Factor Antibodies  Once,   STAT        09/12/19 0829   09/12/19 0823  T4, free  Once,   STAT  09/12/19 0829   09/11/19 1821  Occult blood card to lab, stool RN will collect  Once,   STAT       Question:  Specimen to be collected by:  Answer:  RN will collect   09/11/19 1820          Vitals/Pain Today's Vitals   09/12/19 1035 09/12/19 1049 09/12/19 1130 09/12/19 1203  BP:  128/66 124/66 140/70  Pulse:  67 70 64  Resp:  (!) 22 18   Temp:      TempSrc:      SpO2:  97% 97% 98%  Weight:      Height:      PainSc: 0-No pain       Isolation Precautions No active isolations  Medications Medications  estradiol (ESTRACE) tablet 2 mg (2 mg Oral Given 09/12/19 0839)  levothyroxine (SYNTHROID) tablet 100 mcg (100 mcg Oral Given  09/12/19 0839)  pantoprazole (PROTONIX) EC tablet 40 mg (40 mg Oral Given 09/12/19 0838)  ondansetron (ZOFRAN) tablet 8 mg (8 mg Oral Given 09/12/19 0550)  0.9 %  sodium chloride infusion (has no administration in time range)  acetaminophen (TYLENOL) tablet 650 mg (650 mg Oral Given 09/12/19 0838)    Or  acetaminophen (TYLENOL) suppository 650 mg ( Rectal See Alternative 09/12/19 0838)  traZODone (DESYREL) tablet 25 mg (25 mg Oral Given 09/12/19 0839)  meclizine (ANTIVERT) tablet 12.5 mg (12.5 mg Oral Given 09/12/19 1056)  0.9 %  sodium chloride infusion (0 mL/hr Intravenous Stopped 09/12/19 0433)  cyanocobalamin ((VITAMIN B-12)) injection 1,000 mcg (1,000 mcg Intramuscular Given 09/11/19 2108)    Mobility walks Low fall risk   Focused Assessments    R Recommendations: See Admitting Provider Note  Report given to:   Additional Notes:

## 2019-09-12 NOTE — Evaluation (Signed)
Clinical/Bedside Swallow Evaluation Patient Details  Name: [name] MRN: [id number] Date of Birth: 1965/12/23  Today's Date: 09/12/2019 Time: SLP Start Time (ACUTE ONLY): 1452 SLP Stop Time (ACUTE ONLY): 1505 SLP Time Calculation (min) (ACUTE ONLY): 13 min  Past Medical History:  Past Medical History:  Diagnosis Date  . Anemia    chronic anemia, does not tolerate iron  . Arthritis   . Cellulitis    in past with surgery, and occassional swelling of feet and legs in past  . GERD (gastroesophageal reflux disease)   . Headache    occasional migraines  . Influenza-like symptoms    07-24-14 "fever, cough, body aches" started Tamiflu-has had 2 doses, now afebrile; cough remains productive with some chest congestion-mucus varies yellow to tan-is feeling better after Tamiflu started. pt will make Dr. Randa Evens aware.  . Menometrorrhagia 04/25/2000   Right parotubal cyst x 2.  . Palpitation    caused by high thyroid in past  . Phlebitis    Past Surgical History:  Past Surgical History:  Procedure Laterality Date  . BUNIONECTOMY Bilateral   . ESOPHAGOGASTRODUODENOSCOPY (EGD) WITH PROPOFOL N/A 08/05/2014   Procedure: ESOPHAGOGASTRODUODENOSCOPY (EGD) WITH PROPOFOL;  Surgeon: Carman Ching, MD;  Location: [facility];  Service: Endoscopy;  Laterality: N/A;  . thumb surgery Left   . VAGINAL HYSTERECTOMY     removal of paratubal cyst.   HPI:  [name] is a 54 y.o. female with medical history significant of Graves' disease, GERD, hearing loss.  She is status post hysterectomy in 2005.    Assessment / Plan / Recommendation Clinical Impression  Pt was seen for a bedside swallow evaluation and she presents with suspected functional oropharyngeal swallowing abilities.  Pt was encountered awake/alert with spouse present at bedside.  Pt reported a hx of GERD and stated that she intermittently has globus sensation with PO intake.  Oral mechanism exam was unremarkable.  Pt consumed trials of  thin liquid and regular solids.  Pt fed herself independently and she demonstrated good bolus acceptance, timely mastication, suspected timely AP transport/swallow initiation, and consistent hyolaryngeal elevation/excursion to observation and palpation.  No overt s/sx of aspiration were observed with any trials.  Recommend continuation of regular solids and thin liquids with medications administered whole with liquid and strict adherence to reflux precautions.  No further skilled ST is warranted at this time.  Please re-consult if additional needs arise.    SLP Visit Diagnosis: Dysphagia, unspecified (R13.10)    Aspiration Risk  Mild aspiration risk    Diet Recommendation Regular;Thin liquid   Liquid Administration via: Cup;Straw Medication Administration: Whole meds with liquid Supervision: Patient able to self feed Compensations: Slow rate;Small sips/bites Postural Changes: Seated upright at 90 degrees;Remain upright for at least 30 minutes after po intake    Other  Recommendations Oral Care Recommendations: Oral care BID   Follow up Recommendations None        Swallow Study   General HPI: Leina A Jay is a 54 y.o. female with medical history significant of Graves' disease, GERD, hearing loss.  She is status post hysterectomy in 2005.  Type of Study: Bedside Swallow Evaluation Previous Swallow Assessment: None Diet Prior to this Study: Regular;Thin liquids Temperature Spikes Noted: No Respiratory Status: Room air History of Recent Intubation: No Behavior/Cognition: Alert;Cooperative;Pleasant mood Oral Cavity Assessment: Within Functional Limits Oral Care Completed by SLP: No Oral Cavity - Dentition: Adequate natural dentition Vision: Functional for self-feeding Self-Feeding Abilities: Able to feed self Patient Positioning: Upright  in bed Baseline Vocal Quality: Normal Volitional Swallow: Able to elicit    Oral/Motor/Sensory Function Overall Oral Motor/Sensory Function:  Within functional limits   Ice Chips Ice chips: Not tested   Thin Liquid Thin Liquid: Within functional limits    Nectar Thick Nectar Thick Liquid: Not tested   Honey Thick Honey Thick Liquid: Not tested   Puree Puree: Not tested   Solid     Solid: Within functional limits Presentation: [place] E[facility] M.S., [place] Office: [phone]  Elvia Collum Joshiah Traynham 09/12/2019,3:08 PM

## 2019-09-12 NOTE — Discharge Summary (Signed)
Physician Discharge Summary  [name] JXB:[id number] DOB: 12/04/1965   PCP: Maurice Small, MD  Admit date: 09/11/2019 Discharge date: 09/12/2019 Length of Stay: 0 days   Code Status: Full Code  Admitted From:  Home Discharged to:   Home Home Health:  None  Equipment/Devices:  None Discharge Condition:  Stable  Recommendations for Outpatient Follow-up   1. Follow up with GI this week. May need repeat EGD to evaluate for strictures/further B12 deficiency workup. PPI discontinued due to B12 deficiency 2. Levothyroxine dose decreased to 88 mcg, repeat testing in 6 weeks 3. Follow up CBC  4. Follow up intrinsic factor antibodies.  5. May need IV B12 injections if she is unable to absorb PO vitamins  Hospital Summary  Per HPI:   [name] is a 54 y.o. female with medical history significant of Graves' disease, GERD, hearing loss.  She is status post hysterectomy in 2005.  She reports chronic anemia in the past and has not been able to tolerate iron regularly.  Reports that she last tried to give blood in February and was noted to have a hemoglobin of 11.5 at that time.  She was unable to donate.  She also has a hemoglobin in Care Everywhere of 12.6 in December of last year.  She reports an approximate 62-month history of worsening acid reflux, which caused her to increase her Dexilant.  She has nausea constipation and just generalized weakness ongoing since that time, and was blaming it on the medicine.  She continued to get worse and noted head spinning that became worse with walking, movement, or changes in position.  She complains of fatigue, feeling exhausted, nausea without vomiting.  She denies melena, hematochezia, hematuria, and hematemesis . ED Course: She is found to have hemoglobin of 6.5 a B12 level of less than 50, and an MCV of 121.  Her iron studies are normal as is her folate.  Her Hemoccult is negative. She was given 1 dose of B12 in the ED and 2 u PRBCs with improved Hb  to 9.2  Patient had signs/symptoms of vitamin b12 deficiency: bilateral lower extremity paresthesias of toes and glossitis. Reported hot flashes but stated she completed menopause.   Had an episode of vertigo which resolved with meclizine  She was seen by SLP and PT  Vitamin B12 deficiency workup initiated while inpatient:   - TFTs evaluated and showed slightly elevated free T4 indicating iatrogenic hyperthyroidism and her Synthroid was decreased at discharge to 88 mcg.   - Intrinsic factor antibodies ordered, pending  Plan: Started on Vitamin B12 1000 mcg PO daily, discontinued PPI and optimizing thyroid function. Follow up CBC this week with GI follow up    A & P   Principal Problem:   Vitamin B12 deficiency anemia Active Problems:   Gastroesophageal reflux disease   Graves disease   Obesity (BMI 30.0-34.9)   Thyroid dysfunction   Vitamin B 12 deficiency   B12 deficiency anemia   1. Symptomatic macrocytic anemia secondary to vitamin B12 deficiency, s/p 2 u PRBC transfusion 1. MCV 125, Hb 6.5->9 after transfusion. Folate normal 2. FOBT negative 3. Repeat CBC and outpatient follow up  2. Symptomatic Vitamin B12 deficiency, possibly multifactorial: long standing PPI therapy, Iatrogenic Hyperthyroidism?, Possibly absorptive issue 1. Signs/Symptoms: Macrocytic anemia, bilateral lower extremity paresthesias and imbalance (could be vertigo), glossitis 2. Received B12 injection x1 in ED 3. D/C PPI 4. decrease Levothyroxine 5. Vitamin B12 1000 mcg PO daily 6. Check other vitamins  for deficiencies 7. Follow up anti-IF 8. GI follow up already scheduled  3. Iatrogenic hyperthyroidism with history of Grave's Disease s/p radioactive iodine 1. On Synthroid 100 mcg for acquired hypothyroidism 2. TSH 0.53, Free T4 1.16 (slighly elevated) 3. Synthroid decreased to 88 mcg, outpatient follow up  4. GERD 1. Associated sore throat and hoarseness 2. Could be from strictures +/- b12  deficiency  3. Recently increased her PPI 4. SLP eval: mild aspiration risk, regular thin liquid diet 5. Discontinue PPI and follow up with GI  5. Obesity 1. Counseled on healthy lifestyle  6. Vertigo 1. PT eval 2. Resolved with meclizine, can get OTC    Consultants  . none  Procedures  . 2 u PRBC transfusion  Antibiotics   Anti-infectives (From admission, onward)   None       Subjective  Patient seen and examined at bedside no acute distress and resting comfortably. Initially wanted to go home this morning from the ED but decided to stay for further observation. Complained of recurrent dizziness which resolved with meclizine. Revisited this afternoon and stated she felt better and said she did well without issues with PT. Tolerating diet. In good spirits and anticipating discharge. Husband at bedside  Denies any chest pain, shortness of breath, fever, nausea, vomiting, bowel complaints. Otherwise ROS negative    Objective   Discharge Exam: Vitals:   09/12/19 1307 09/12/19 1404  BP: (!) 111/52 (!) 114/54  Pulse: 67 62  Resp:  14  Temp: 98.2 F (36.8 C) 97.7 F (36.5 C)  SpO2:     Vitals:   09/12/19 1130 09/12/19 1203 09/12/19 1307 09/12/19 1404  BP: 124/66 140/70 (!) 111/52 (!) 114/54  Pulse: 70 64 67 62  Resp: 18   14  Temp:   98.2 F (36.8 C) 97.7 F (36.5 C)  TempSrc:   Oral Oral  SpO2: 97% 98%    Weight:      Height:        Physical Exam Vitals and nursing note reviewed. Exam conducted with a chaperone present.  Constitutional:      Appearance: Normal appearance.  HENT:     Head: Normocephalic and atraumatic.     Mouth/Throat:     Comments: Tongue is red Eyes:     Conjunctiva/sclera: Conjunctivae normal.  Cardiovascular:     Rate and Rhythm: Normal rate and regular rhythm.  Pulmonary:     Effort: Pulmonary effort is normal.     Breath sounds: Normal breath sounds.  Abdominal:     General: Abdomen is flat.     Palpations: Abdomen is  soft.  Musculoskeletal:        General: No swelling or tenderness.  Skin:    Coloration: Skin is not jaundiced or pale.  Neurological:     Mental Status: She is alert. Mental status is at baseline.  Psychiatric:        Mood and Affect: Mood normal.        Behavior: Behavior normal.       The results of significant diagnostics from this hospitalization (including imaging, microbiology, ancillary and laboratory) are listed below for reference.     Microbiology: Recent Results (from the past 240 hour(s))  Novel Coronavirus, NAA ([facility])     Status: None   Collection Time: 09/11/19  2:05 PM   Specimen: Nasopharyngeal(NP) swabs in vial transport medium   Nasopharynge  Result Value Ref Range Status   SARS-CoV-2, NAA Not Detected Not Detected Final  Comment: This nucleic acid amplification test was developed and its performance characteristics determined by [employer]. Nucleic acid amplification tests include RT-PCR and TMA. This test has not been FDA cleared or approved. This test has been authorized by FDA under an Emergency Use Authorization (EUA). This test is only authorized for the duration of time the declaration that circumstances exist justifying the authorization of the emergency use of in vitro diagnostic tests for detection of SARS-CoV-2 virus and/or diagnosis of COVID-19 infection under section 564(b)(1) of the Act, 21 U.S.C. 478GNF-6(O) (1), unless the authorization is terminated or revoked sooner. When diagnostic testing is negative, the possibility of a false negative result should be considered in the context of a patient's recent exposures and the presence of clinical signs and symptoms consistent with COVID-19. An individual without symptoms of COVID-19 and who is not shedding SARS-CoV-2 virus wo uld expect to have a negative (not detected) result in this assay.   SARS-COV-2, NAA 2 DAY TAT     Status: None   Collection Time: 09/11/19  2:05 PM    Nasopharynge  Result Value Ref Range Status   SARS-CoV-2, NAA 2 DAY TAT Performed  Final     Labs: BNP (last 3 results) No results for input(s): BNP in the last 8760 hours. Basic Metabolic Panel: Recent Labs  Lab 09/11/19 1506  NA 135  K 3.9  CL 99  CO2 29  GLUCOSE 98  BUN 12  CREATININE 0.72  CALCIUM 9.0   Liver Function Tests: No results for input(s): AST, ALT, ALKPHOS, BILITOT, PROT, ALBUMIN in the last 168 hours. No results for input(s): LIPASE, AMYLASE in the last 168 hours. No results for input(s): AMMONIA in the last 168 hours. CBC: Recent Labs  Lab 09/11/19 1506 09/11/19 1816 09/12/19 0549 09/12/19 1049  WBC 5.6 5.3 4.8  --   NEUTROABS 2.9  --   --   --   HGB 6.6* 6.5* 9.2* 9.0*  HCT 18.6* 18.2* 26.2* 25.4*  MCV 121* 125.5* 109.2*  --   PLT 128* 114* 98*  --    Cardiac Enzymes: No results for input(s): CKTOTAL, CKMB, CKMBINDEX, TROPONINI in the last 168 hours. BNP: Invalid input(s): POCBNP CBG: No results for input(s): GLUCAP in the last 168 hours. D-Dimer No results for input(s): DDIMER in the last 72 hours. Hgb A1c No results for input(s): HGBA1C in the last 72 hours. Lipid Profile No results for input(s): CHOL, HDL, LDLCALC, TRIG, CHOLHDL, LDLDIRECT in the last 72 hours. Thyroid function studies Recent Labs    09/12/19 0901  TSH 0.533   Anemia work up Recent Labs    09/11/19 1816  VITAMINB12 <50*  FOLATE 17.0  FERRITIN 159  TIBC 279  IRON 78  RETICCTPCT 2.3   Urinalysis No results found for: COLORURINE, APPEARANCEUR, LABSPEC, PHURINE, GLUCOSEU, HGBUR, BILIRUBINUR, KETONESUR, PROTEINUR, UROBILINOGEN, NITRITE, LEUKOCYTESUR Sepsis Labs Invalid input(s): PROCALCITONIN,  WBC,  LACTICIDVEN Microbiology Recent Results (from the past 240 hour(s))  Novel Coronavirus, NAA ([facility])     Status: None   Collection Time: 09/11/19  2:05 PM   Specimen: Nasopharyngeal(NP) swabs in vial transport medium   Nasopharynge  Result Value Ref Range  Status   SARS-CoV-2, NAA Not Detected Not Detected Final    Comment: This nucleic acid amplification test was developed and its performance characteristics determined by [employer]. Nucleic acid amplification tests include RT-PCR and TMA. This test has not been FDA cleared or approved. This test has been authorized by FDA under an Emergency  Use Authorization (EUA). This test is only authorized for the duration of time the declaration that circumstances exist justifying the authorization of the emergency use of in vitro diagnostic tests for detection of SARS-CoV-2 virus and/or diagnosis of COVID-19 infection under section 564(b)(1) of the Act, 21 U.S.C. 161WRU-0(A360bbb-3(b) (1), unless the authorization is terminated or revoked sooner. When diagnostic testing is negative, the possibility of a false negative result should be considered in the context of a patient's recent exposures and the presence of clinical signs and symptoms consistent with COVID-19. An individual without symptoms of COVID-19 and who is not shedding SARS-CoV-2 virus wo uld expect to have a negative (not detected) result in this assay.   SARS-COV-2, NAA 2 DAY TAT     Status: None   Collection Time: 09/11/19  2:05 PM   Nasopharynge  Result Value Ref Range Status   SARS-CoV-2, NAA 2 DAY TAT Performed  Final    Discharge Instructions     Discharge Instructions    Diet - low sodium heart healthy   Complete by: As directed    Discharge instructions   Complete by: As directed    You were seen in the hospital for symptomatic anemia and received a blood transfusion. This was thought to be due to your vitamin B12 deficiency. Your vitamin deficiency could be from multiple reasons: long standing dexlansoprazole treatment, thyroid dysfunction, possibly an underlying disorder with nutrient absorption.  - Stop taking your dexlansoprazole. Do not take any other proton pump inhibitors for acid reflux such as omeprazole,  nexium, protonix, pantoprazole, etc.  - See your GI doctor this week - Decrease your synthroid dose to 88 mcg daily and get follow up labs in 6 weeks - take Vitamin B12 1000 mcg daily by mouth and follow up with your primary care physician regarding any further injection therapy if needed, though you may not need this - take a multivitamin and eat plenty of vegetables - you can take meclizine over the counter for dizziness - get repeat lab work prior to your GI appointment this week to follow up your blood levels. If you have any change or worsening of your symptoms then contact your PCP or return to the ED   Increase activity slowly   Complete by: As directed      Allergies as of 09/12/2019      Reactions   Penicillins    Cant remember Did it involve swelling of the face/tongue/throat, SOB, or low BP? N Did it involve sudden or severe rash/hives, skin peeling, or any reaction on the inside of your mouth or nose? Y Did you need to seek medical attention at a hospital or doctor's office? No When did it last happen?Baby If all above answers are "NO", may proceed with cephalosporin use.   Sulfa Drugs Cross Reactors Nausea And Vomiting   Hives,rash with topicals      Medication List    STOP taking these medications   dexlansoprazole 60 MG capsule Commonly known as: DEXILANT     TAKE these medications   estradiol 2 MG tablet Commonly known as: ESTRACE Take 2 mg by mouth daily.   levothyroxine 88 MCG tablet Commonly known as: SYNTHROID Take 1 tablet (88 mcg total) by mouth daily. Start taking on: September 13, 2019 What changed:   medication strength  how much to take   ondansetron 8 MG tablet Commonly known as: ZOFRAN Take 8 mg by mouth every 8 (eight) hours as needed for nausea or vomiting.  vitamin B-12 1000 MCG tablet Commonly known as: CYANOCOBALAMIN Take 1 tablet (1,000 mcg total) by mouth daily.       Allergies  Allergen Reactions  . Penicillins     Cant  remember  Did it involve swelling of the face/tongue/throat, SOB, or low BP? N Did it involve sudden or severe rash/hives, skin peeling, or any reaction on the inside of your mouth or nose? Y Did you need to seek medical attention at a hospital or doctor's office? No When did it last happen?Baby If all above answers are "NO", may proceed with cephalosporin use.    . Sulfa Drugs Cross Reactors Nausea And Vomiting    Hives,rash with topicals     Dispo: The patient is from: Home              Anticipated d/c is to: Home              Anticipated d/c date is today              Patient currently is medically stable to d/c.       Time coordinating discharge: Over 30 minutes   SIGNED:   Jae Dire, D.O. [facility] Pager: [phone]  09/12/2019, 4:29 PM

## 2019-09-12 NOTE — Plan of Care (Signed)
  Problem: Education: Goal: Knowledge of General Education information will improve Description Including pain rating scale, medication(s)/side effects and non-pharmacologic comfort measures Outcome: Progressing   Problem: Health Behavior/Discharge Planning: Goal: Ability to manage health-related needs will improve Outcome: Progressing   

## 2019-09-12 NOTE — Evaluation (Signed)
Physical Therapy Evaluation Patient Details Name: [name] MRN: [id number] DOB: [birth year] Today's Date: 09/12/2019   History of Present Illness  Pt admitted through ED with near syncope and Hgb of 6.6  Clinical Impression  Pt admitted as above but now with Hbg of 9.0.  Pt demonstrates ability to perform all basic mobility tasks including ambulation x 400' at supervision to IND level.  Pt with no c/o dizziness but with orthostatic BP as follows; supine 146/67, sit 135/62, stand 123/63, and after ambulation 136/70.  Pt states continues to feel fatigued but MUCH improved from time of admit and feels more comfortable with dc home and ability to manage there.    Follow Up Recommendations No PT follow up    Equipment Recommendations  None recommended by PT    Recommendations for Other Services       Precautions / Restrictions Precautions Precautions: Fall Restrictions Weight Bearing Restrictions: No      Mobility  Bed Mobility Overal bed mobility: Modified Independent                Transfers Overall transfer level: Modified independent Equipment used: None             General transfer comment: use of UEs to self assist but no outside physical assist required  Ambulation/Gait Ambulation/Gait assistance: Min guard;Supervision;Independent Gait Distance (Feet): 400 Feet Assistive device: None Gait Pattern/deviations: Step-through pattern;Decreased step length - right;Decreased step length - left;Shuffle;Wide base of support Gait velocity: decreased from normal per pt   General Gait Details: mild initial instability but no LOB and progressed to IND ambulation including side-stepping and back-stepping  Stairs            Wheelchair Mobility    Modified Rankin (Stroke Patients Only)       Balance                                             Pertinent Vitals/Pain Pain Assessment: No/denies pain    Home Living Family/patient  expects to be discharged to:: Private residence Living Arrangements: Spouse/significant other Available Help at Discharge: Family Type of Home: House Home Access: Stairs to enter Entrance Stairs-Rails: None Technical brewer of Steps: 2 Home Layout: One level Home Equipment: None      Prior Function Level of Independence: Independent               Hand Dominance        Extremity/Trunk Assessment   Upper Extremity Assessment Upper Extremity Assessment: Overall WFL for tasks assessed    Lower Extremity Assessment Lower Extremity Assessment: Overall WFL for tasks assessed       Communication   Communication: No difficulties  Cognition Arousal/Alertness: Awake/alert Behavior During Therapy: WFL for tasks assessed/performed Overall Cognitive Status: Within Functional Limits for tasks assessed                                        General Comments      Exercises     Assessment/Plan    PT Assessment Patent does not need any further PT services  PT Problem List         PT Treatment Interventions      PT Goals (Current goals can be found in the Care Plan section)  Acute Rehab PT Goals Patient Stated Goal: Resume previous lifestyle but with decreased fatigue PT Goal Formulation: All assessment and education complete, DC therapy    Frequency     Barriers to discharge        Co-evaluation               AM-PAC PT "6 Clicks" Mobility  Outcome Measure Help needed turning from your back to your side while in a flat bed without using bedrails?: None Help needed moving from lying on your back to sitting on the side of a flat bed without using bedrails?: None Help needed moving to and from a bed to a chair (including a wheelchair)?: None Help needed standing up from a chair using your arms (e.g., wheelchair or bedside chair)?: None Help needed to walk in hospital room?: A Little Help needed climbing 3-5 steps with a railing? : A  Little 6 Click Score: 22    End of Session Equipment Utilized During Treatment: Gait belt Activity Tolerance: Patient tolerated treatment well;Patient limited by fatigue Patient left: in bed;with call bell/phone within reach;with family/visitor present Nurse Communication: Mobility status PT Visit Diagnosis: Difficulty in walking, not elsewhere classified (R26.2)    Time: 1510-1535 PT Time Calculation (min) (ACUTE ONLY): 25 min   Charges:   PT Evaluation $PT Eval Low Complexity: 1 Low          Mauro Kaufmann PT Acute Rehabilitation Services Pager [phone] Office [phone]   [name] 09/12/2019, 5:05 PM

## 2019-09-12 NOTE — Discharge Instructions (Signed)

## 2019-09-13 LAB — TYPE AND SCREEN
ABO/RH(D): O POS
Antibody Screen: NEGATIVE
Unit division: 0
Unit division: 0

## 2019-09-13 LAB — BPAM RBC
Blood Product Expiration Date: 202107142359
Blood Product Expiration Date: 202107142359
ISSUE DATE / TIME: 202106112137
ISSUE DATE / TIME: 202106120120
Unit Type and Rh: 5100
Unit Type and Rh: 5100

## 2019-09-14 LAB — PATHOLOGIST SMEAR REVIEW: Path Review: UNDETERMINED

## 2019-09-14 LAB — INTRINSIC FACTOR ANTIBODIES: Intrinsic Factor: 8.8 AU/mL — ABNORMAL HIGH (ref 0.0–1.1)

## 2019-09-15 ENCOUNTER — Telehealth: Payer: Self-pay | Admitting: Internal Medicine

## 2019-09-15 NOTE — Telephone Encounter (Signed)
Opened in error

## 2019-09-15 NOTE — Progress Notes (Signed)
I called the patient today to discuss her vitamin D deficiency, vitamin B12 deficiency and her positive intrinsic factor antibodies/pernicious anemia diagnosis, however she did not answer.   She was discharged on Vitamin B12 PO supplement and advised to start a multivitamin when she was discharged, however we did not have these lab results at her discharge.   She will need to follow up with her PCP and may need to start on Vitamin B12 injections if she is unable to absorb the tablets.  Thank you, Whitney Post, DO

## 2019-09-16 ENCOUNTER — Other Ambulatory Visit: Payer: Self-pay | Admitting: Physician Assistant

## 2019-09-16 DIAGNOSIS — R1310 Dysphagia, unspecified: Secondary | ICD-10-CM

## 2019-09-17 ENCOUNTER — Ambulatory Visit
Admission: RE | Admit: 2019-09-17 | Discharge: 2019-09-17 | Disposition: A | Discharge status: Home / Self Care | Payer: PRIVATE HEALTH INSURANCE | Source: Ambulatory Visit | Attending: Physician Assistant | Admitting: Physician Assistant

## 2019-09-17 DIAGNOSIS — R1310 Dysphagia, unspecified: Secondary | ICD-10-CM

## 2020-02-06 ENCOUNTER — Other Ambulatory Visit: Payer: Self-pay

## 2020-02-06 ENCOUNTER — Ambulatory Visit
Admission: EM | Admit: 2020-02-06 | Discharge: 2020-02-06 | Disposition: A | Discharge status: Home / Self Care | Payer: PRIVATE HEALTH INSURANCE | Attending: Physician Assistant | Admitting: Physician Assistant

## 2020-02-06 DIAGNOSIS — R059 Cough, unspecified: Secondary | ICD-10-CM

## 2020-02-06 DIAGNOSIS — R0982 Postnasal drip: Secondary | ICD-10-CM

## 2020-02-06 DIAGNOSIS — Z1152 Encounter for screening for COVID-19: Secondary | ICD-10-CM

## 2020-02-06 MED ORDER — FLUTICASONE PROPIONATE 50 MCG/ACT NA SUSP
2.0000 | Freq: Every day | NASAL | 0 refills | Status: AC
Start: 2020-02-06 — End: ?

## 2020-02-06 MED ORDER — AZELASTINE HCL 0.1 % NA SOLN: 2.0000 | Freq: Two times a day (BID) | NASAL | 0 refills | Status: AC

## 2020-02-06 MED ORDER — PROMETHAZINE-DM 6.25-15 MG/5ML PO SYRP: 5.0000 mL | ORAL_SOLUTION | Freq: Four times a day (QID) | ORAL | 0 refills | Status: AC | PRN

## 2020-02-06 NOTE — ED Triage Notes (Signed)
Pt states she has had a cough x 4 days and nasal drainage that is causing the cough. Pt is aox4 and ambulatory.

## 2020-02-06 NOTE — ED Provider Notes (Signed)
[facility]    CSN: [id number] Arrival date & time: 02/06/20  0954      History   Chief Complaint Chief Complaint  Patient presents with  . Cough    x 4 days  . Nasal Congestion    x 4 days    HPI [name] is a 54 y.o. female.   54 year old female comes in for 4 day of URI symptoms. Cough, nasal drainage, post nasal drip. Denies fever, chills, body aches.Denies shortness of breath, loss of taste/smell. otc cold medicine without relief. Never smoker.      Past Medical History:  Diagnosis Date  . Anemia    chronic anemia, does not tolerate iron  . Arthritis   . Cellulitis    in past with surgery, and occassional swelling of feet and legs in past  . GERD (gastroesophageal reflux disease)   . Headache    occasional migraines  . Influenza-like symptoms    07-24-14 "fever, cough, body aches" started Tamiflu-has had 2 doses, now afebrile; cough remains productive with some chest congestion-mucus varies yellow to tan-is feeling better after Tamiflu started. pt will make Dr. Randa Evens aware.  . Menometrorrhagia 04/25/2000   Right parotubal cyst x 2.  . Palpitation    caused by high thyroid in past  . Phlebitis     Patient Active Problem List   Diagnosis Date Noted  . Vitamin B 12 deficiency 09/11/2019  . Vitamin B12 deficiency anemia 09/11/2019  . B12 deficiency anemia 09/11/2019  . Arthritis 08/01/2018  . Thyroid dysfunction 08/01/2018  . Laryngopharyngeal reflux (LPR) 10/01/2016  . Hoarseness 10/01/2016  . Chronic anemia 06/15/2015  . Gastroesophageal reflux disease 06/15/2015  . Graves disease 06/15/2015  . Obesity (BMI 30.0-34.9) 06/15/2015  . Heart palpitations 08/30/2010    Past Surgical History:  Procedure Laterality Date  . BUNIONECTOMY Bilateral   . ESOPHAGOGASTRODUODENOSCOPY (EGD) WITH PROPOFOL N/A 08/05/2014   Procedure: ESOPHAGOGASTRODUODENOSCOPY (EGD) WITH PROPOFOL;  Surgeon: Carman Ching, MD;  Location: [facility];  Service:  Endoscopy;  Laterality: N/A;  . thumb surgery Left   . VAGINAL HYSTERECTOMY     removal of paratubal cyst.    OB History   No obstetric history on file.      Home Medications    Prior to Admission medications   Medication Sig Start Date End Date Taking? Authorizing Provider  estradiol (ESTRACE) 2 MG tablet Take 2 mg by mouth daily.     Yes [provider]  levothyroxine (SYNTHROID) 88 MCG tablet Take 1 tablet (88 mcg total) by mouth daily. 09/13/19 02/06/20 Yes Jae Dire, MD  azelastine (ASTELIN) 0.1 % nasal spray Place 2 sprays into both nostrils 2 (two) times daily. 02/06/20   Cathie Hoops, Namon Villarin V, PA-C  fluticasone (FLONASE) 50 MCG/ACT nasal spray Place 2 sprays into both nostrils daily. 02/06/20   Cathie Hoops, Ida Uppal V, PA-C  promethazine-dextromethorphan (PROMETHAZINE-DM) 6.25-15 MG/5ML syrup Take 5 mLs by mouth 4 (four) times daily as needed for cough. 02/06/20   Belinda Fisher, PA-C    Family History History reviewed. No pertinent family history.  Social History Social History   Tobacco Use  . Smoking status: Never Smoker  . Smokeless tobacco: Never Used  Vaping Use  . Vaping Use: Never used  Substance Use Topics  . Alcohol use: No  . Drug use: No     Allergies   Penicillins and Sulfa drugs cross reactors   Review of Systems Review of Systems  Reason unable  to perform ROS: See HPI as above.     Physical Exam Triage Vital Signs ED Triage Vitals  Enc Vitals Group     BP 02/06/20 1034 (!) 166/85     Pulse Rate 02/06/20 1034 (!) 59     Resp 02/06/20 1034 20     Temp 02/06/20 1034 (!) 97.5 F (36.4 C)     Temp Source 02/06/20 1034 Oral     SpO2 02/06/20 1034 98 %     Weight --      Height --      Head Circumference --      Peak Flow --      Pain Score 02/06/20 1051 0     Pain Loc --      Pain Edu? --      Excl. in GC? --    No data found.  Updated Vital Signs BP (!) 166/85 (BP Location: Right Arm)   Pulse (!) 59   Temp (!) 97.5 F (36.4 C) (Oral)   Resp 20    SpO2 98%      Physical Exam Constitutional:      General: She is not in acute distress.    Appearance: Normal appearance. She is well-developed. She is not ill-appearing, toxic-appearing or diaphoretic.  HENT:     Head: Normocephalic and atraumatic.     Right Ear: Tympanic membrane, ear canal and external ear normal. Tympanic membrane is not erythematous or bulging.     Left Ear: Tympanic membrane, ear canal and external ear normal. Tympanic membrane is not erythematous or bulging.     Nose:     Right Sinus: Frontal sinus tenderness present. No maxillary sinus tenderness.     Left Sinus: Frontal sinus tenderness present. No maxillary sinus tenderness.     Mouth/Throat:     Mouth: Mucous membranes are moist.     Pharynx: Oropharynx is clear. Uvula midline. Posterior oropharyngeal erythema present.  Eyes:     Conjunctiva/sclera: Conjunctivae normal.     Pupils: Pupils are equal, round, and reactive to light.  Cardiovascular:     Rate and Rhythm: Normal rate and regular rhythm.  Pulmonary:     Effort: Pulmonary effort is normal. No accessory muscle usage, prolonged expiration, respiratory distress or retractions.     Breath sounds: No decreased air movement or transmitted upper airway sounds. No decreased breath sounds.     Comments: LCTAB Musculoskeletal:     Cervical back: Normal range of motion and neck supple.  Skin:    General: Skin is warm and dry.  Neurological:     Mental Status: She is alert and oriented to person, place, and time.      UC Treatments / Results  Labs (all labs ordered are listed, but only abnormal results are displayed) Labs Reviewed  NOVEL CORONAVIRUS, NAA    EKG   Radiology No results found.  Procedures Procedures (including critical care time)  Medications Ordered in UC Medications - No data to display  Initial Impression / Assessment and Plan / UC Course  I have reviewed the triage vital signs and the nursing notes.  Pertinent  labs & imaging results that were available during my care of the patient were reviewed by me and considered in my medical decision making (see chart for details).     COVID PCR test ordered. Patient to quarantine until testing results return. No alarming signs on exam. LCTAB. Symptomatic treatment discussed.  Push fluids.  Return precautions given.  Patient expresses  understanding and agrees to plan.  Final Clinical Impressions(s) / UC Diagnoses   Final diagnoses:  Encounter for screening for COVID-19  Post-nasal drainage  Cough    ED Prescriptions    Medication Sig Dispense Auth. Provider   promethazine-dextromethorphan (PROMETHAZINE-DM) 6.25-15 MG/5ML syrup Take 5 mLs by mouth 4 (four) times daily as needed for cough. 118 mL Brightyn Mozer V, PA-C   fluticasone (FLONASE) 50 MCG/ACT nasal spray Place 2 sprays into both nostrils daily. 1 g Terin Cragle V, PA-C   azelastine (ASTELIN) 0.1 % nasal spray Place 2 sprays into both nostrils 2 (two) times daily. 30 mL Cathie Hoops, Tank Difiore V, PA-C     I have reviewed the PDMP during this encounter.   Belinda Fisher, PA-C 02/06/20 1125

## 2020-02-06 NOTE — Discharge Instructions (Signed)
COVID PCR testing ordered. I would like you to quarantine until testing results. Flonase and azelastine for nasal drainage. Promethazine DM for cough at night. Tylenol/motrin for pain and fever. Keep hydrated, urine should be clear to pale yellow in color. If experiencing shortness of breath, trouble breathing, go to the emergency department for further evaluation needed.

## 2020-02-07 LAB — NOVEL CORONAVIRUS, NAA: SARS-CoV-2, NAA: NOT DETECTED

## 2020-02-07 LAB — SARS-COV-2, NAA 2 DAY TAT

## 2022-03-06 IMAGING — RF DG ESOPHAGUS
3 series · 14 of 17 positions shown · non-contrast
Comparison: 06/12/2014

CLINICAL DATA: Globus sensation. Sensation of food sticking in
throat. Gastroesophageal reflux disease.

EXAM:
ESOPHOGRAM / BARIUM SWALLOW / BARIUM TABLET STUDY
TECHNIQUE: Combined double contrast and single contrast examination performed
using effervescent crystals, thick barium liquid, and thin barium
liquid. The patient was observed with fluoroscopy swallowing a 13 mm
barium sulphate tablet.
FLUOROSCOPY TIME:  Fluoroscopy Time:  1 minutes 18 seconds
Radiation Exposure Index (if provided by the fluoroscopic device):
84.4 mGy
Number of Acquired Spot Images: 0

[Series 1: sequence · 3 of 19 frames shown (1 of 2)]
[frame 1/19]
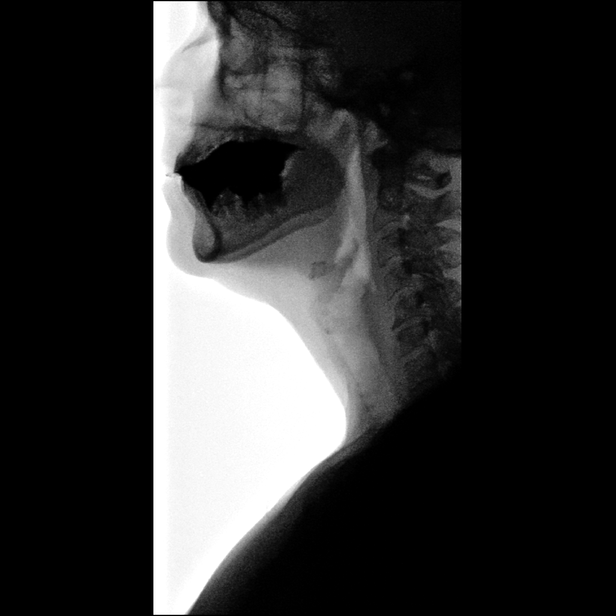
[frame 3/19]
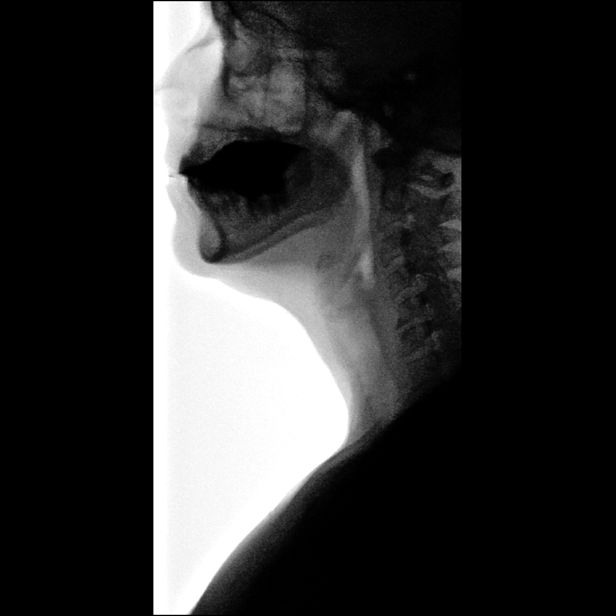
[frame 17/19]
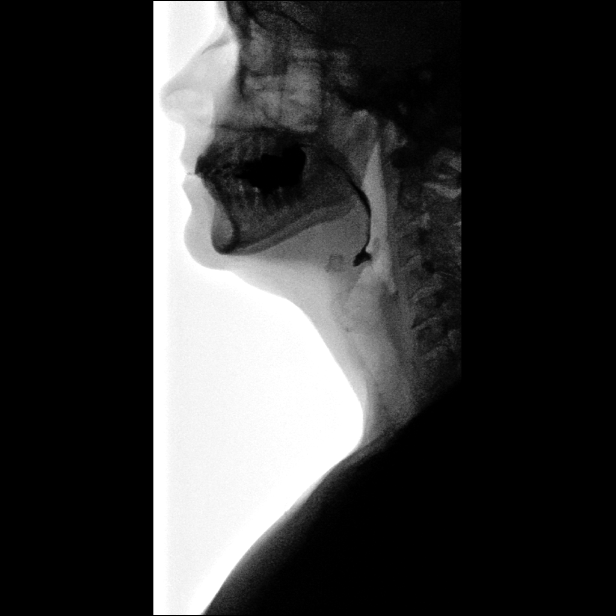

[Series 2: sequence · 4 of 11 frames shown (2 of 2)]
[frame 2/11]
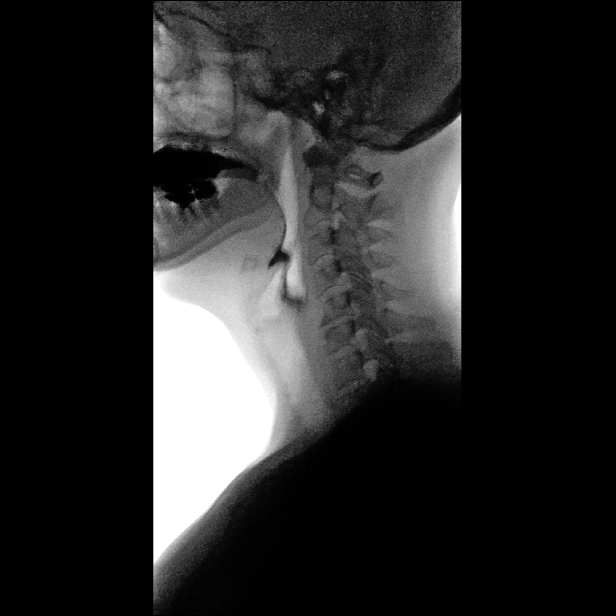
[frame 3/11]
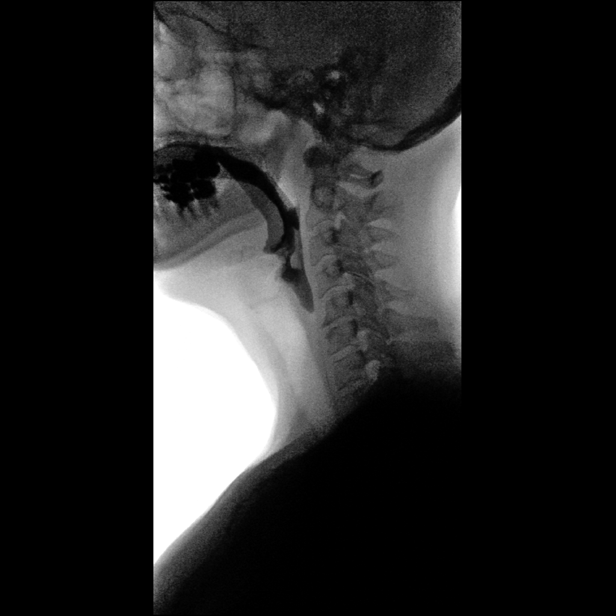
[frame 6/11]
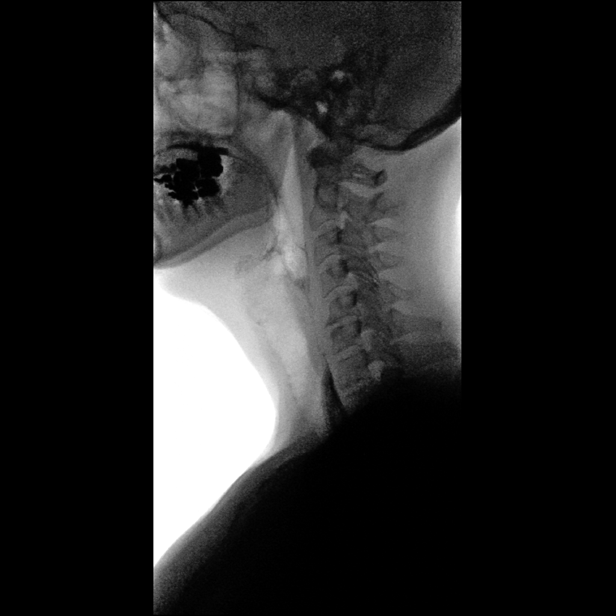
[frame 10/11]
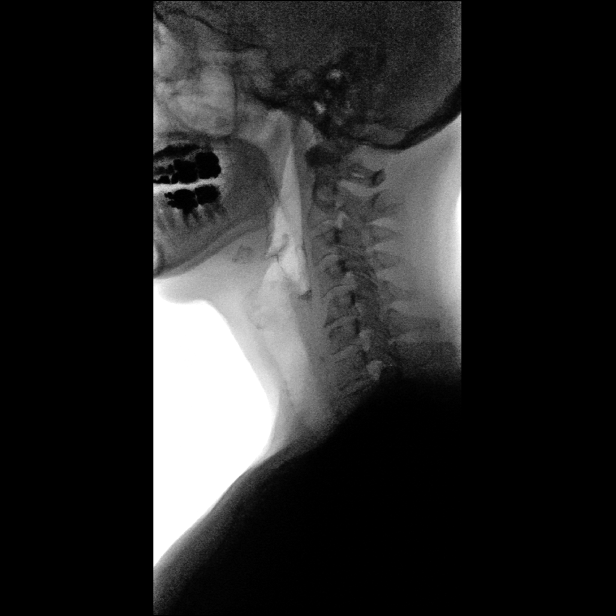

[Series 3: one shot · 7 of 9 slices shown]
[im 2/9]
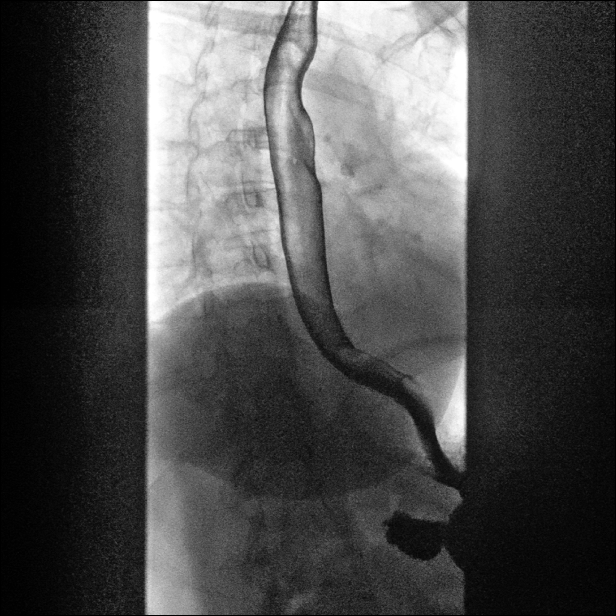
[im 3/9]
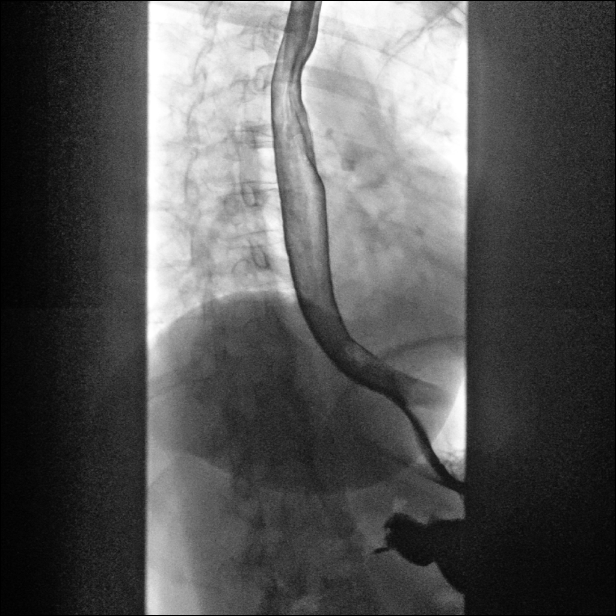
[im 4/9]
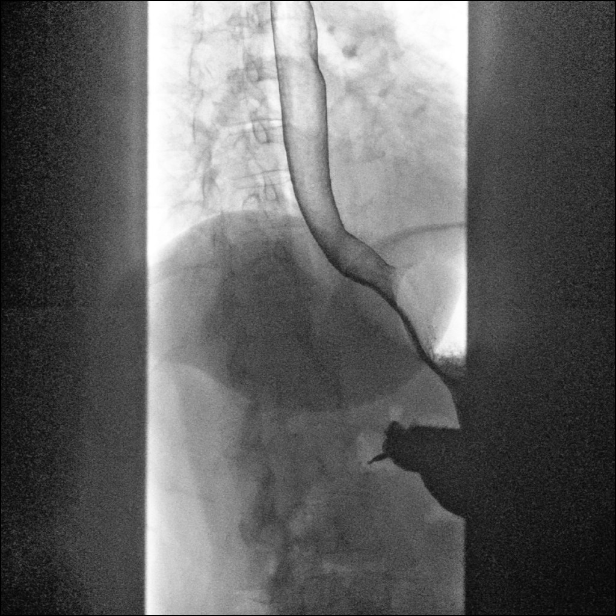
[im 5/9]
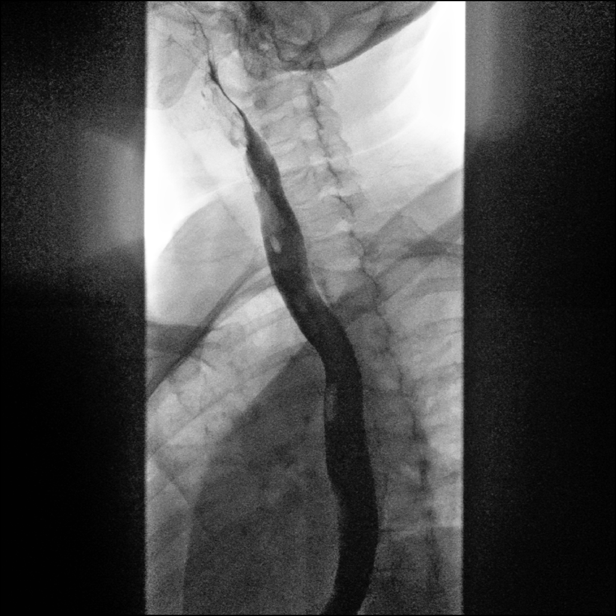
[im 6/9]
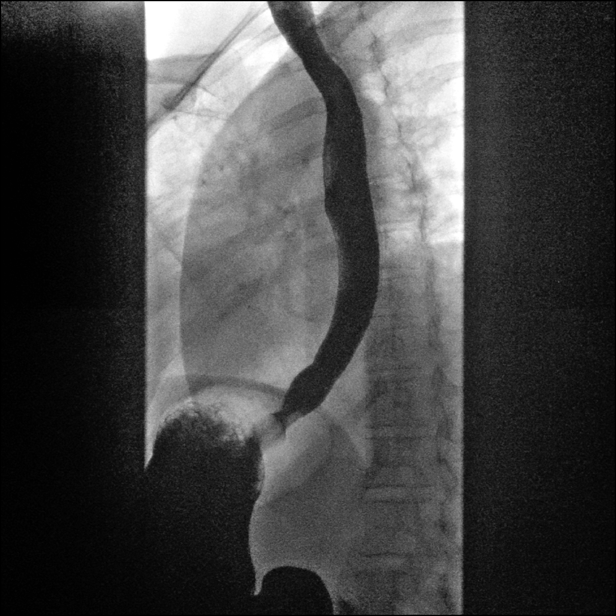
[im 8/9]
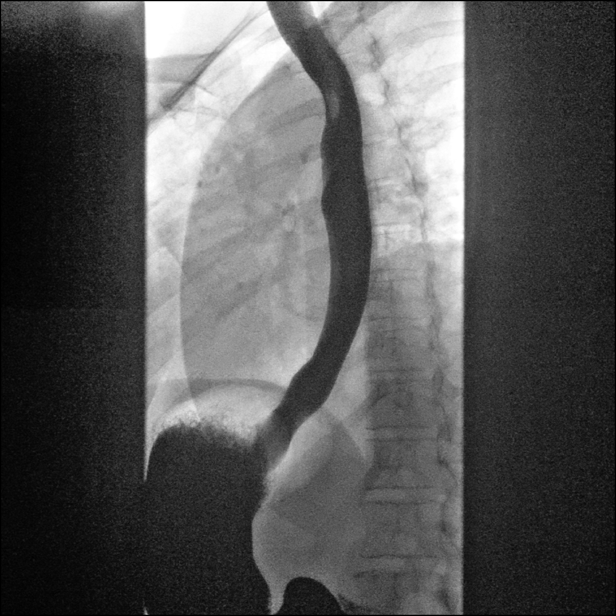
[im 9/9]
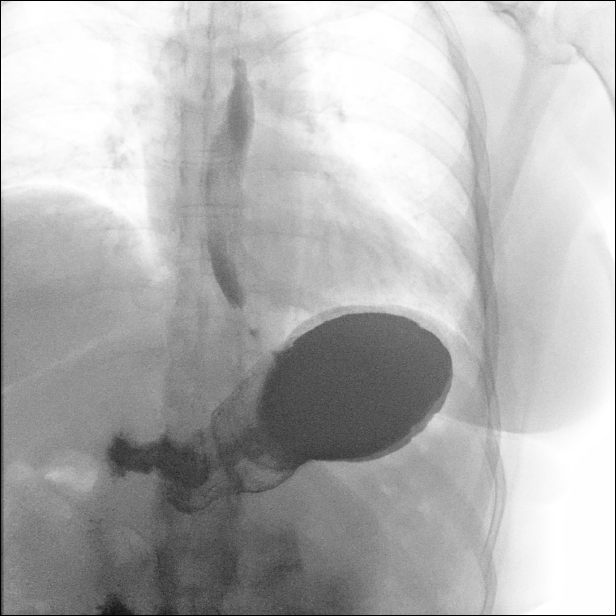

[14 of 17 positions shown; findings below may reference images not displayed]

FINDINGS: Swallowing appears normal. No evidence of vestibular penetration or
aspiration.

No evidence of esophageal mass or stricture. No findings of
esophagitis noted. Esophageal motility is within normal limits.

Mild to moderate gastroesophageal reflux was seen to the level of
the proximal thoracic esophagus with patient in supine position. No
evidence of hiatal hernia. An ingested 13mm barium tablet passed
freely through the esophagus, and into the stomach.
IMPRESSION: Mild-to-moderate gastroesophageal reflux.

No evidence of hiatal hernia, esophageal stricture, or other
significant abnormality.

## 2022-07-24 ENCOUNTER — Ambulatory Visit: Payer: PRIVATE HEALTH INSURANCE | Attending: Family Medicine | Admitting: Physical Therapy

## 2022-07-24 ENCOUNTER — Other Ambulatory Visit: Payer: Self-pay

## 2022-07-24 DIAGNOSIS — M6281 Muscle weakness (generalized): Secondary | ICD-10-CM | POA: Diagnosis present

## 2022-07-24 DIAGNOSIS — R279 Unspecified lack of coordination: Secondary | ICD-10-CM | POA: Insufficient documentation

## 2022-07-24 DIAGNOSIS — R293 Abnormal posture: Secondary | ICD-10-CM | POA: Diagnosis present

## 2022-07-24 NOTE — Therapy (Signed)
OUTPATIENT PHYSICAL THERAPY FEMALE PELVIC EVALUATION   Patient Name: [name] MRN: [id number] DOB:01-04-1966, 57 y.o., female Today's Date: 07/24/2022  END OF SESSION:  PT End of Session - 07/24/22 0929     Visit Number 1    Date for PT Re-Evaluation 10/23/22    Authorization Type [employer]    PT Start Time 0930    PT Stop Time 1015    PT Time Calculation (min) 45 min    Activity Tolerance Patient tolerated treatment well    Behavior During Therapy WFL for tasks assessed/performed             Past Medical History:  Diagnosis Date   Anemia    chronic anemia, does not tolerate iron   Arthritis    Cellulitis    in past with surgery, and occassional swelling of feet and legs in past   GERD (gastroesophageal reflux disease)    Headache    occasional migraines   Influenza-like symptoms    07-24-14 "fever, cough, body aches" started Tamiflu-has had 2 doses, now afebrile; cough remains productive with some chest congestion-mucus varies yellow to tan-is feeling better after Tamiflu started. pt will make Dr. Randa Evens aware.   Menometrorrhagia 04/25/2000   Right parotubal cyst x 2.   Palpitation    caused by high thyroid in past   Phlebitis    Past Surgical History:  Procedure Laterality Date   BUNIONECTOMY Bilateral    ESOPHAGOGASTRODUODENOSCOPY (EGD) WITH PROPOFOL N/A 08/05/2014   Procedure: ESOPHAGOGASTRODUODENOSCOPY (EGD) WITH PROPOFOL;  Surgeon: Carman Ching, MD;  Location: [facility];  Service: Endoscopy;  Laterality: N/A;   thumb surgery Left    VAGINAL HYSTERECTOMY     removal of paratubal cyst.   Patient Active Problem List   Diagnosis Date Noted   Vitamin B 12 deficiency 09/11/2019   Vitamin B12 deficiency anemia 09/11/2019   B12 deficiency anemia 09/11/2019   Arthritis 08/01/2018   Thyroid dysfunction 08/01/2018   Laryngopharyngeal reflux (LPR) 10/01/2016   Hoarseness 10/01/2016   Chronic anemia 06/15/2015   Gastroesophageal reflux disease 06/15/2015    Graves disease 06/15/2015   Obesity (BMI 30.0-34.9) 06/15/2015   Heart palpitations 08/30/2010    PCP: Ronnie Doss, MD  REFERRING PROVIDER: Irven Coe, MD   REFERRING DIAG: R15.9 (ICD-10-CM) - Full incontinence of feces  THERAPY DIAG:  Unspecified lack of coordination - Plan: PT plan of care cert/re-cert  Muscle weakness (generalized) - Plan: PT plan of care cert/re-cert  Abnormal posture - Plan: PT plan of care cert/re-cert  Rationale for Evaluation and Treatment: Rehabilitation  ONSET DATE: a few years ago  SUBJECTIVE:  SUBJECTIVE STATEMENT: IBS with constipation and states she has had IBS for a very long time and currently has a bowel movement 2x per week. Notes she doesn't have full loss of bowels but will have stool present with wiping even after a bowel movement or will have a small amount of stool with trying to hold it with strong urge and doesn't like to have a bowel movement a public. Exercising makes this worse as well. Pt states she is unaware of leakage of bowels sometimes. Also sometimes will have a bowel movement, then while doing a task wont have an urge but will feel leakage while coming out. Pt also has urinary leakage with coughing or sneezing.    Fluid intake: Yes: water - 40oz per day, 2 cups of coffee per day, 2 diet mt dews    PAIN:  Are you having pain? No   PRECAUTIONS: None  WEIGHT BEARING RESTRICTIONS: No  FALLS:  Has patient fallen in last 6 months? No  LIVING ENVIRONMENT: Lives with: lives with their family Lives in: House/apartment     PLOF: Independent  PATIENT GOALS: to have less leakage  PERTINENT HISTORY:  Gerd, vaginal hysterectomy, x2 episiotomies, x1 c-section   Sexual abuse: No  BOWEL MOVEMENT: Pain with bowel movement: No Type of bowel  movement:Type (Bristol Stool Scale) 5-6, Frequency 2x a week, and Strain No Fully empty rectum: Yes: feels like it but then moves around and has small leakage sometimes Leakage: Yes: with exercise, holding it too long Pads: Yes: usually one Fiber supplement: Yes: daily takes a fiber supplement   URINATION: Pain with urination: No Fully empty bladder: Yes:   Stream: Strong Urgency: No Frequency: "I think I hold it too long" Leakage: Coughing and Sneezing sometimes with overflow from holding it too long  Pads: No - wears pads mostly due to fecal leakage  INTERCOURSE: Pain with intercourse:  painful with penetration Ability to have vaginal penetration:  Yes: painful penetration Climax: not painful  Marinoff Scale: 0/3  PREGNANCY: Vaginal deliveries 2 Tearing Yes: had an episiotomy with both C-section deliveries 1 (had a set of twins but one passed away shortly after birth) Currently pregnant No  PROLAPSE: None   OBJECTIVE:   DIAGNOSTIC FINDINGS:    COGNITION: Overall cognitive status: Within functional limits for tasks assessed     SENSATION: Light touch: Appears intact Proprioception: Appears intact  MUSCLE LENGTH: Bil hamstrings and adductors limited by 25%   POSTURE: rounded shoulders, forward head, and posterior pelvic tilt   LUMBARAROM/PROM:  A/PROM A/PROM  eval  Flexion Limited by 25%  Extension Limited by 25%  Right lateral flexion Limited by 25%  Left lateral flexion Limited by 25%  Right rotation Limited by 25%  Left rotation Limited by 25%   (Blank rows = not tested)  LOWER EXTREMITY ROM:  WFL  LOWER EXTREMITY MMT:  Bil hips grossly 4/5 knees 5/5 PALPATION:   General  no TTP, fascial restrictions noted in all quadrants                External Perineal Exam no TTP                               Internal Pelvic Floor rectally assessed - mildly decreased tone but good ability to contract on command and fully relax, slight decrease in  strength in posterior (6 o clock on clock face) quadrant  Patient confirms identification and approves  PT to assess internal pelvic floor and treatment Yes  PELVIC MMT:   MMT eval  Vaginal   Internal Anal Sphincter 3/5, 8s, 9 reps  External Anal Sphincter 4/5  Puborectalis 4/5  Diastasis Recti   (Blank rows = not tested)        TONE: Slightly decreased   PROLAPSE: Not seen with rectal assessment   TODAY'S TREATMENT:                                                                                                                              DATE:   07/24/2022 EVAL Examination completed, findings reviewed, pt educated on POC, HEP. Pt motivated to participate in PT and agreeable to attempt recommendations.     PATIENT EDUCATION:  Education details: EXMQZGNY Person educated: Patient Education method: Explanation, Demonstration, Actor cues, Verbal cues, and Handouts Education comprehension: verbalized understanding and returned demonstration  HOME EXERCISE PROGRAM: EXMQZGNY  ASSESSMENT:  CLINICAL IMPRESSION: Patient is a 57 y.o. female  who was seen today for physical therapy evaluation and treatment for fecal incontinence. Pt reports she has had this issue for awhile, also has urinary leakage with coughing and sneezing and sometimes overflow. Pt reports she has a bowel movement about 2x per week ,doesn't strain and is usually on the softer to liquid side. Pt states when she has leakage it is usually more mushy and doesn't feel it happening until she goes to the bathroom. It's usually a small amount in underwear or only when she later returns to bathroom will have some stool present with wiping. Pt also reports she has to wipe a lot to get clean. Pt found to have mild decreased in flexibility in spine and bil hips, decreased strength in hips and core, fascial restrictions in all abdominal quadrants. Patient consented to internal pelvic floor assessment rectally this date and found to  have decreased strength, endurance, and coordination. Also slightly more weak in posterior quadrant of EAS and benefited from cues for coordination of breathing and contraction. Pt would benefit from additional PT to further address deficits.    OBJECTIVE IMPAIRMENTS: decreased coordination, decreased endurance, decreased strength, increased fascial restrictions, impaired flexibility, impaired tone, improper body mechanics, and postural dysfunction.   ACTIVITY LIMITATIONS: continence  PARTICIPATION LIMITATIONS: interpersonal relationship, shopping, community activity, and occupation  PERSONAL FACTORS: Time since onset of injury/illness/exacerbation and 1 comorbidity: x2 episiotomies   are also affecting patient's functional outcome.   REHAB POTENTIAL: Good  CLINICAL DECISION MAKING: Stable/uncomplicated  EVALUATION COMPLEXITY: Low   GOALS: Goals reviewed with patient? Yes  SHORT TERM GOALS: Target date: 08/21/22  Pt to be I with HEP.  Baseline: Goal status: INITIAL  2.  Pt will report her BMs are complete due to improved bowel habits and evacuation techniques.  Baseline:  Goal status: INITIAL  3.  Pt will be able to functional actions such as walking for 15 mins without leakage  Baseline:  Goal status: INITIAL  4.  Pt to be I with abdominal massage and breathing mechanics for improved bowel habits.  Baseline:  Goal status: INITIAL   LONG TERM GOALS: Target date: 10/23/22  Pt to be I with advanced HEP.  Baseline:  Goal status: INITIAL  2.  Pt will report 3 BMs per week due to improved muscle tone and coordination with BMs.  Baseline:  Goal status: INITIAL  3.  Pt will be able to functional actions such as walking for 30 mins without leakage  Baseline:  Goal status: INITIAL  4.  Pt to demonstrate at least 4/5 with ability to hold vaginal contraction for at least 8s and rectal contraction for at least 45s pelvic floor strength for improved pelvic stability and  decreased strain at pelvic floor/ decrease leakage.  Baseline:  Goal status: INITIAL  5.  Pt to demonstrate improved coordination of pelvic floor and breathing mechanics with 10# squat with appropriate synergistic patterns to decrease pain and leakage at least 75% of the time.    Baseline:  Goal status: INITIAL  6.  Pt to be I with knack method for decreased leakage with stressors for improved QOL.  Baseline:  Goal status: INITIAL  PLAN:  PT FREQUENCY: every other week  PT DURATION:  6 sessions  PLANNED INTERVENTIONS: Therapeutic exercises, Therapeutic activity, Neuromuscular re-education, Balance training, Gait training, Patient/Family education, Self Care, Dry Needling, Electrical stimulation, Spinal mobilization, Cryotherapy, Moist heat, scar mobilization, Taping, Biofeedback, and Manual therapy  PLAN FOR NEXT SESSION: NMRE internally for improved coordination and strengthening as needed and pt consents, core and hip strengthening, coordination of breathing and core and pelvic floor with strengthening exercises, breathing mechanics and voiding mechanics   Otelia Sergeant, PT, DPT 07/23/2408:49 AM

## 2022-07-30 ENCOUNTER — Ambulatory Visit: Payer: PRIVATE HEALTH INSURANCE | Admitting: Physical Therapy

## 2022-08-16 ENCOUNTER — Ambulatory Visit: Payer: PRIVATE HEALTH INSURANCE | Attending: Family Medicine | Admitting: Physical Therapy

## 2022-08-16 DIAGNOSIS — R293 Abnormal posture: Secondary | ICD-10-CM | POA: Insufficient documentation

## 2022-08-16 DIAGNOSIS — R279 Unspecified lack of coordination: Secondary | ICD-10-CM | POA: Insufficient documentation

## 2022-08-16 DIAGNOSIS — M6281 Muscle weakness (generalized): Secondary | ICD-10-CM | POA: Diagnosis present

## 2022-08-16 NOTE — Therapy (Signed)
OUTPATIENT PHYSICAL THERAPY FEMALE PELVIC TREATMENT   Patient Name: [name] MRN: [id number] DOB:Jul 20, 1965, 57 y.o., female Today's Date: 08/16/2022  END OF SESSION:  PT End of Session - 08/16/22 0844     Visit Number 2    Date for PT Re-Evaluation 10/23/22    Authorization Type [employer]    PT Start Time 0845    PT Stop Time 0927    PT Time Calculation (min) 42 min    Activity Tolerance Patient tolerated treatment well    Behavior During Therapy WFL for tasks assessed/performed             Past Medical History:  Diagnosis Date   Anemia    chronic anemia, does not tolerate iron   Arthritis    Cellulitis    in past with surgery, and occassional swelling of feet and legs in past   GERD (gastroesophageal reflux disease)    Headache    occasional migraines   Influenza-like symptoms    07-24-14 "fever, cough, body aches" started Tamiflu-has had 2 doses, now afebrile; cough remains productive with some chest congestion-mucus varies yellow to tan-is feeling better after Tamiflu started. pt will make Dr. Randa Evens aware.   Menometrorrhagia 04/25/2000   Right parotubal cyst x 2.   Palpitation    caused by high thyroid in past   Phlebitis    Past Surgical History:  Procedure Laterality Date   BUNIONECTOMY Bilateral    ESOPHAGOGASTRODUODENOSCOPY (EGD) WITH PROPOFOL N/A 08/05/2014   Procedure: ESOPHAGOGASTRODUODENOSCOPY (EGD) WITH PROPOFOL;  Surgeon: Carman Ching, MD;  Location: [facility];  Service: Endoscopy;  Laterality: N/A;   thumb surgery Left    VAGINAL HYSTERECTOMY     removal of paratubal cyst.   Patient Active Problem List   Diagnosis Date Noted   Vitamin B 12 deficiency 09/11/2019   Vitamin B12 deficiency anemia 09/11/2019   B12 deficiency anemia 09/11/2019   Arthritis 08/01/2018   Thyroid dysfunction 08/01/2018   Laryngopharyngeal reflux (LPR) 10/01/2016   Hoarseness 10/01/2016   Chronic anemia 06/15/2015   Gastroesophageal reflux disease 06/15/2015    Graves disease 06/15/2015   Obesity (BMI 30.0-34.9) 06/15/2015   Heart palpitations 08/30/2010    PCP: Ronnie Doss, MD  REFERRING PROVIDER: Irven Coe, MD   REFERRING DIAG: R15.9 (ICD-10-CM) - Full incontinence of feces  THERAPY DIAG:  Unspecified lack of coordination  Muscle weakness (generalized)  Abnormal posture  Rationale for Evaluation and Treatment: Rehabilitation  ONSET DATE: a few years ago  SUBJECTIVE:  SUBJECTIVE STATEMENT: Bowels are getting better but still happens. Urine leakage is better happening less often, has had coughed and not leaked.       Fluid intake: Yes: water - 40oz per day, 2 cups of coffee per day, 2 diet mt dews    PAIN:  Are you having pain? No   PRECAUTIONS: None  WEIGHT BEARING RESTRICTIONS: No  FALLS:  Has patient fallen in last 6 months? No  LIVING ENVIRONMENT: Lives with: lives with their family Lives in: House/apartment     PLOF: Independent  PATIENT GOALS: to have less leakage  PERTINENT HISTORY:  Gerd, vaginal hysterectomy, x2 episiotomies, x1 c-section   Sexual abuse: No  BOWEL MOVEMENT: Pain with bowel movement: No Type of bowel movement:Type (Bristol Stool Scale) 5-6, Frequency 2x a week, and Strain No Fully empty rectum: Yes: feels like it but then moves around and has small leakage sometimes Leakage: Yes: with exercise, holding it too long Pads: Yes: usually one Fiber supplement: Yes: daily takes a fiber supplement   URINATION: Pain with urination: No Fully empty bladder: Yes:   Stream: Strong Urgency: No Frequency: "I think I hold it too long" Leakage: Coughing and Sneezing sometimes with overflow from holding it too long  Pads: No - wears pads mostly due to fecal leakage  INTERCOURSE: Pain with intercourse:   painful with penetration Ability to have vaginal penetration:  Yes: painful penetration Climax: not painful  Marinoff Scale: 0/3  PREGNANCY: Vaginal deliveries 2 Tearing Yes: had an episiotomy with both C-section deliveries 1 (had a set of twins but one passed away shortly after birth) Currently pregnant No  PROLAPSE: None   OBJECTIVE:   DIAGNOSTIC FINDINGS:    COGNITION: Overall cognitive status: Within functional limits for tasks assessed     SENSATION: Light touch: Appears intact Proprioception: Appears intact  MUSCLE LENGTH: Bil hamstrings and adductors limited by 25%   POSTURE: rounded shoulders, forward head, and posterior pelvic tilt   LUMBARAROM/PROM:  A/PROM A/PROM  eval  Flexion Limited by 25%  Extension Limited by 25%  Right lateral flexion Limited by 25%  Left lateral flexion Limited by 25%  Right rotation Limited by 25%  Left rotation Limited by 25%   (Blank rows = not tested)  LOWER EXTREMITY ROM:  WFL  LOWER EXTREMITY MMT:  Bil hips grossly 4/5 knees 5/5 PALPATION:   General  no TTP, fascial restrictions noted in all quadrants                External Perineal Exam no TTP                               Internal Pelvic Floor rectally assessed - mildly decreased tone but good ability to contract on command and fully relax, slight decrease in strength in posterior (6 o clock on clock face) quadrant  Patient confirms identification and approves PT to assess internal pelvic floor and treatment Yes  PELVIC MMT:   MMT eval  Vaginal   Internal Anal Sphincter 3/5, 8s, 9 reps  External Anal Sphincter 4/5  Puborectalis 4/5  Diastasis Recti   (Blank rows = not tested)        TONE: Slightly decreased   PROLAPSE: Not seen with rectal assessment   TODAY'S TREATMENT:  DATE:   08/16/22:  Manual: abdominal massage  completed by PT to improve bowel peristalsis and educated pt on how to complete this at home, handout given as well. Pt demonstrated good recall with her rep. Pt also continued to be educated on voiding mechanics.  Therapeutic activity: pt educated on urge drill and handout given, no additional questions.    PATIENT EDUCATION:  Education details: EXMQZGNY Person educated: Patient Education method: Programmer, multimedia, Demonstration, Actor cues, Verbal cues, and Handouts Education comprehension: verbalized understanding and returned demonstration  HOME EXERCISE PROGRAM: EXMQZGNY  ASSESSMENT:  CLINICAL IMPRESSION: Patient presents for treatment, session focused on manual for abdominal massage and education on urge drill to decreased frequency with water intake. Pt reports she has been limiting water and fluids because she doesn't want to go as much. So urge drill given. Pt denied additional questions. Pt would benefit from additional PT to further address deficits.    OBJECTIVE IMPAIRMENTS: decreased coordination, decreased endurance, decreased strength, increased fascial restrictions, impaired flexibility, impaired tone, improper body mechanics, and postural dysfunction.   ACTIVITY LIMITATIONS: continence  PARTICIPATION LIMITATIONS: interpersonal relationship, shopping, community activity, and occupation  PERSONAL FACTORS: Time since onset of injury/illness/exacerbation and 1 comorbidity: x2 episiotomies   are also affecting patient's functional outcome.   REHAB POTENTIAL: Good  CLINICAL DECISION MAKING: Stable/uncomplicated  EVALUATION COMPLEXITY: Low   GOALS: Goals reviewed with patient? Yes  SHORT TERM GOALS: Target date: 08/21/22  Pt to be I with HEP.  Baseline: Goal status: INITIAL  2.  Pt will report her BMs are complete due to improved bowel habits and evacuation techniques.  Baseline:  Goal status: INITIAL  3.  Pt will be able to functional actions such as walking for 15  mins without leakage  Baseline:  Goal status: INITIAL  4.  Pt to be I with abdominal massage and breathing mechanics for improved bowel habits.  Baseline:  Goal status: INITIAL   LONG TERM GOALS: Target date: 10/23/22  Pt to be I with advanced HEP.  Baseline:  Goal status: INITIAL  2.  Pt will report 3 BMs per week due to improved muscle tone and coordination with BMs.  Baseline:  Goal status: INITIAL  3.  Pt will be able to functional actions such as walking for 30 mins without leakage  Baseline:  Goal status: INITIAL  4.  Pt to demonstrate at least 4/5 with ability to hold vaginal contraction for at least 8s and rectal contraction for at least 45s pelvic floor strength for improved pelvic stability and decreased strain at pelvic floor/ decrease leakage.  Baseline:  Goal status: INITIAL  5.  Pt to demonstrate improved coordination of pelvic floor and breathing mechanics with 10# squat with appropriate synergistic patterns to decrease pain and leakage at least 75% of the time.    Baseline:  Goal status: INITIAL  6.  Pt to be I with knack method for decreased leakage with stressors for improved QOL.  Baseline:  Goal status: INITIAL  PLAN:  PT FREQUENCY: every other week  PT DURATION:  6 sessions  PLANNED INTERVENTIONS: Therapeutic exercises, Therapeutic activity, Neuromuscular re-education, Balance training, Gait training, Patient/Family education, Self Care, Dry Needling, Electrical stimulation, Spinal mobilization, Cryotherapy, Moist heat, scar mobilization, Taping, Biofeedback, and Manual therapy  PLAN FOR NEXT SESSION: NMRE internally for improved coordination and strengthening as needed and pt consents, core and hip strengthening, coordination of breathing and core and pelvic floor with strengthening exercises, breathing mechanics and voiding mechanics   Otelia Sergeant, PT,  DPT 05/16/249:32 AM

## 2022-08-16 NOTE — Patient Instructions (Addendum)
~  5 mins at least once a day either once in the morning or evening.   Urge Incontinence  Ideal urination frequency is every 2-4 wakeful hours, which equates to 5-8 times within a 24-hour period.   Urge incontinence is leakage that occurs when the bladder muscle contracts, creating a sudden need to go before getting to the bathroom.   Going too often when your bladder isn't actually full can disrupt the body's automatic signals to store and hold urine longer, which will increase urgency/frequency.  In this case, the bladder "is running the show" and strategies can be learned to retrain this pattern.   One should be able to control the first urge to urinate, at around .  The bladder can hold up to a "grande latte," or . To help you gain control, practice the Urge Drill below when urgency strikes.  This drill will help retrain your bladder signals and allow you to store and hold urine longer.  The overall goal is to stretch out your time between voids to reach a more manageable voiding schedule.    Practice your "quick flicks" often throughout the day (each waking hour) even when you don't need feel the urge to go.  This will help strengthen your pelvic floor muscles, making them more effective in controlling leakage.  Urge Drill  When you feel an urge to go, follow these steps to regain control: Stop what you are doing and be still Take one deep breath, directing your air into your abdomen Think an affirming thought, such as "I've got this." Do 5 quick flicks of your pelvic floor Walk with control to the bathroom to void, or delay voiding

## 2022-08-30 ENCOUNTER — Ambulatory Visit: Payer: PRIVATE HEALTH INSURANCE | Admitting: Physical Therapy

## 2022-08-30 DIAGNOSIS — R279 Unspecified lack of coordination: Secondary | ICD-10-CM

## 2022-08-30 DIAGNOSIS — M6281 Muscle weakness (generalized): Secondary | ICD-10-CM

## 2022-08-30 DIAGNOSIS — R293 Abnormal posture: Secondary | ICD-10-CM

## 2022-08-30 NOTE — Therapy (Addendum)
OUTPATIENT PHYSICAL THERAPY FEMALE PELVIC TREATMENT   Patient Name: [name] MRN: [id number] DOB:February 22, 1966, 57 y.o., female Today's Date: 08/30/2022  END OF SESSION:  PT End of Session - 08/30/22 1601     Visit Number 3    Date for PT Re-Evaluation 10/23/22    Authorization Type [employer]    PT Start Time 1611    PT Stop Time 1650    PT Time Calculation (min) 39 min    Activity Tolerance Patient tolerated treatment well    Behavior During Therapy WFL for tasks assessed/performed             Past Medical History:  Diagnosis Date   Anemia    chronic anemia, does not tolerate iron   Arthritis    Cellulitis    in past with surgery, and occassional swelling of feet and legs in past   GERD (gastroesophageal reflux disease)    Headache    occasional migraines   Influenza-like symptoms    07-24-14 "fever, cough, body aches" started Tamiflu-has had 2 doses, now afebrile; cough remains productive with some chest congestion-mucus varies yellow to tan-is feeling better after Tamiflu started. pt will make Dr. Randa Evens aware.   Menometrorrhagia 04/25/2000   Right parotubal cyst x 2.   Palpitation    caused by high thyroid in past   Phlebitis    Past Surgical History:  Procedure Laterality Date   BUNIONECTOMY Bilateral    ESOPHAGOGASTRODUODENOSCOPY (EGD) WITH PROPOFOL N/A 08/05/2014   Procedure: ESOPHAGOGASTRODUODENOSCOPY (EGD) WITH PROPOFOL;  Surgeon: Carman Ching, MD;  Location: [facility];  Service: Endoscopy;  Laterality: N/A;   thumb surgery Left    VAGINAL HYSTERECTOMY     removal of paratubal cyst.   Patient Active Problem List   Diagnosis Date Noted   Vitamin B 12 deficiency 09/11/2019   Vitamin B12 deficiency anemia 09/11/2019   B12 deficiency anemia 09/11/2019   Arthritis 08/01/2018   Thyroid dysfunction 08/01/2018   Laryngopharyngeal reflux (LPR) 10/01/2016   Hoarseness 10/01/2016   Chronic anemia 06/15/2015   Gastroesophageal reflux disease 06/15/2015    Graves disease 06/15/2015   Obesity (BMI 30.0-34.9) 06/15/2015   Heart palpitations 08/30/2010    PCP: Ronnie Doss, MD  REFERRING PROVIDER: Irven Coe, MD   REFERRING DIAG: R15.9 (ICD-10-CM) - Full incontinence of feces  THERAPY DIAG:  Unspecified lack of coordination  Muscle weakness (generalized)  Abnormal posture  Rationale for Evaluation and Treatment: Rehabilitation  ONSET DATE: a few years ago  SUBJECTIVE:  SUBJECTIVE STATEMENT: Bowel movements have been much better, since starting abdominal massage "no issues at all". No leakage, no pain, and frequency better to every other day now, stool type now 4 almost always.  Urine is much better too, no longer wearing pads and hasn't had any leakage here either.     Fluid intake: Yes: water - 40oz per day, 2 cups of coffee per day, 2 diet mt dews    PAIN:  Are you having pain? No   PRECAUTIONS: None  WEIGHT BEARING RESTRICTIONS: No  FALLS:  Has patient fallen in last 6 months? No  LIVING ENVIRONMENT: Lives with: lives with their family Lives in: House/apartment     PLOF: Independent  PATIENT GOALS: to have less leakage  PERTINENT HISTORY:  Gerd, vaginal hysterectomy, x2 episiotomies, x1 c-section   Sexual abuse: No  BOWEL MOVEMENT: Pain with bowel movement: No Type of bowel movement:Type (Bristol Stool Scale) 5-6, Frequency 2x a week, and Strain No Fully empty rectum: Yes: feels like it but then moves around and has small leakage sometimes Leakage: Yes: with exercise, holding it too long Pads: Yes: usually one Fiber supplement: Yes: daily takes a fiber supplement   URINATION: Pain with urination: No Fully empty bladder: Yes:   Stream: Strong Urgency: No Frequency: "I think I hold it too long" Leakage: Coughing and  Sneezing sometimes with overflow from holding it too long  Pads: No - wears pads mostly due to fecal leakage  INTERCOURSE: Pain with intercourse:  painful with penetration Ability to have vaginal penetration:  Yes: painful penetration Climax: not painful  Marinoff Scale: 0/3  PREGNANCY: Vaginal deliveries 2 Tearing Yes: had an episiotomy with both C-section deliveries 1 (had a set of twins but one passed away shortly after birth) Currently pregnant No  PROLAPSE: None   OBJECTIVE:   DIAGNOSTIC FINDINGS:    COGNITION: Overall cognitive status: Within functional limits for tasks assessed     SENSATION: Light touch: Appears intact Proprioception: Appears intact  MUSCLE LENGTH: Bil hamstrings and adductors limited by 25%   POSTURE: rounded shoulders, forward head, and posterior pelvic tilt   LUMBARAROM/PROM:  A/PROM A/PROM  eval  Flexion Limited by 25%  Extension Limited by 25%  Right lateral flexion Limited by 25%  Left lateral flexion Limited by 25%  Right rotation Limited by 25%  Left rotation Limited by 25%   (Blank rows = not tested)  LOWER EXTREMITY ROM:  WFL  LOWER EXTREMITY MMT:  Bil hips grossly 4/5 knees 5/5 PALPATION:   General  no TTP, fascial restrictions noted in all quadrants                External Perineal Exam no TTP                               Internal Pelvic Floor rectally assessed - mildly decreased tone but good ability to contract on command and fully relax, slight decrease in strength in posterior (6 o clock on clock face) quadrant  Patient confirms identification and approves PT to assess internal pelvic floor and treatment Yes  PELVIC MMT:   MMT eval  Vaginal   Internal Anal Sphincter 3/5, 8s, 9 reps  External Anal Sphincter 4/5  Puborectalis 4/5  Diastasis Recti   (Blank rows = not tested)        TONE: Slightly decreased   PROLAPSE: Not seen with rectal assessment   TODAY'S TREATMENT:  DATE:   08/16/22:  Manual: abdominal massage completed by PT to improve bowel peristalsis and educated pt on how to complete this at home, handout given as well. Pt demonstrated good recall with her rep. Pt also continued to be educated on voiding mechanics.  Therapeutic activity: pt educated on urge drill and handout given, no additional questions.   08/30/22: NMRE: all exercises cued for coordination of breathing mechanics and pelvic floor to improve coordination of pelvic floor with activity to decreased leakage as well as increase strength of pelvic floor.   Opp hand/knee press 2x10 Bridges 2x10  2x10 Sit to stand  2x10 palloffs green band  HEP updated for these and reviewed with pt    PATIENT EDUCATION:  Education details: EXMQZGNY Person educated: Patient Education method: Programmer, multimedia, Demonstration, Tactile cues, Verbal cues, and Handouts Education comprehension: verbalized understanding and returned demonstration  HOME EXERCISE PROGRAM: EXMQZGNY  ASSESSMENT:  CLINICAL IMPRESSION: Patient presents for treatment, session focused on hip and core strengthening with coordination of pelvic floor and breathing for all exercises. Pt tolerated well, denied all leakage.pleased with progress so far.  Pt would benefit from additional PT to further address deficits.    OBJECTIVE IMPAIRMENTS: decreased coordination, decreased endurance, decreased strength, increased fascial restrictions, impaired flexibility, impaired tone, improper body mechanics, and postural dysfunction.   ACTIVITY LIMITATIONS: continence  PARTICIPATION LIMITATIONS: interpersonal relationship, shopping, community activity, and occupation  PERSONAL FACTORS: Time since onset of injury/illness/exacerbation and 1 comorbidity: x2 episiotomies   are also affecting patient's functional outcome.   REHAB POTENTIAL:  Good  CLINICAL DECISION MAKING: Stable/uncomplicated  EVALUATION COMPLEXITY: Low   GOALS: Goals reviewed with patient? Yes  SHORT TERM GOALS: Target date: 08/21/22  Pt to be I with HEP.  Baseline: Goal status: MET  2.  Pt will report her BMs are complete due to improved bowel habits and evacuation techniques.  Baseline:  Goal status: MET  3.  Pt will be able to functional actions such as walking for 15 mins without leakage  Baseline:  Goal status: MET  4.  Pt to be I with abdominal massage and breathing mechanics for improved bowel habits.  Baseline:  Goal status: MET   LONG TERM GOALS: Target date: 10/23/22  Pt to be I with advanced HEP.  Baseline:  Goal status: INITIAL  2.  Pt will report 3 BMs per week due to improved muscle tone and coordination with BMs.  Baseline:  Goal status: INITIAL  3.  Pt will be able to functional actions such as walking for 30 mins without leakage  Baseline:  Goal status: INITIAL  4.  Pt to demonstrate at least 4/5 with ability to hold vaginal contraction for at least 8s and rectal contraction for at least 45s pelvic floor strength for improved pelvic stability and decreased strain at pelvic floor/ decrease leakage.  Baseline:  Goal status: INITIAL  5.  Pt to demonstrate improved coordination of pelvic floor and breathing mechanics with 10# squat with appropriate synergistic patterns to decrease pain and leakage at least 75% of the time.    Baseline:  Goal status: INITIAL  6.  Pt to be I with knack method for decreased leakage with stressors for improved QOL.  Baseline:  Goal status: INITIAL  PLAN:  PT FREQUENCY: every other week  PT DURATION:  6 sessions  PLANNED INTERVENTIONS: Therapeutic exercises, Therapeutic activity, Neuromuscular re-education, Balance training, Gait training, Patient/Family education, Self Care, Dry Needling, Electrical stimulation, Spinal mobilization, Cryotherapy, Moist heat, scar mobilization, Taping,  Biofeedback, and Manual therapy  PLAN FOR NEXT SESSION: NMRE internally for improved coordination and strengthening as needed and pt consents, core and hip strengthening, coordination of breathing and core and pelvic floor with strengthening exercises, breathing mechanics and voiding mechanics   Otelia Sergeant, PT, DPT 05/30/245:00 PM  PHYSICAL THERAPY DISCHARGE SUMMARY  Visits from Start of Care: 3  Current functional level related to goals / functional outcomes: Pt denied any additional needs for PT, requests DC   Remaining deficits: Pt reports symptoms resolved, pleased with progress    Education / Equipment: HEP   Patient agrees to discharge. Patient goals were partially met. Patient is being discharged due to being pleased with the current functional level.  Otelia Sergeant, PT, DPT 07/15/241:13 PM

## 2022-09-11 ENCOUNTER — Encounter: Payer: PRIVATE HEALTH INSURANCE | Admitting: Physical Therapy

## 2022-09-25 ENCOUNTER — Ambulatory Visit: Payer: PRIVATE HEALTH INSURANCE | Admitting: Physical Therapy

## 2022-10-11 ENCOUNTER — Ambulatory Visit: Payer: PRIVATE HEALTH INSURANCE | Attending: Family Medicine | Admitting: Physical Therapy

## 2022-10-11 DIAGNOSIS — R279 Unspecified lack of coordination: Secondary | ICD-10-CM | POA: Insufficient documentation

## 2022-10-11 DIAGNOSIS — M6281 Muscle weakness (generalized): Secondary | ICD-10-CM | POA: Insufficient documentation

## 2022-10-11 DIAGNOSIS — R293 Abnormal posture: Secondary | ICD-10-CM | POA: Insufficient documentation

## 2022-10-15 ENCOUNTER — Encounter: Payer: Self-pay | Admitting: Physical Therapy

## 2022-10-23 ENCOUNTER — Encounter: Payer: PRIVATE HEALTH INSURANCE | Admitting: Physical Therapy

## 2023-01-22 ENCOUNTER — Other Ambulatory Visit (HOSPITAL_COMMUNITY): Payer: Self-pay

## 2023-01-22 MED ORDER — LISDEXAMFETAMINE DIMESYLATE 20 MG PO CAPS
20.0000 mg | ORAL_CAPSULE | Freq: Every day | ORAL | 0 refills | Status: AC
  Filled 2023-01-22: qty 30, 30d supply, fill #0

## 2023-02-01 ENCOUNTER — Other Ambulatory Visit (HOSPITAL_COMMUNITY): Payer: Self-pay

## 2023-05-14 ENCOUNTER — Other Ambulatory Visit (HOSPITAL_COMMUNITY): Payer: Self-pay

## 2023-05-14 MED ORDER — LISDEXAMFETAMINE DIMESYLATE 60 MG PO CAPS
60.0000 mg | ORAL_CAPSULE | Freq: Every day | ORAL | 0 refills | Status: AC
  Filled 2023-05-14: qty 30, 30d supply, fill #0

## 2023-05-15 ENCOUNTER — Other Ambulatory Visit (HOSPITAL_COMMUNITY): Payer: Self-pay

## 2024-01-02 ENCOUNTER — Other Ambulatory Visit (HOSPITAL_COMMUNITY): Payer: Self-pay | Admitting: Family Medicine

## 2024-01-02 DIAGNOSIS — E785 Hyperlipidemia, unspecified: Secondary | ICD-10-CM

## 2024-01-23 ENCOUNTER — Ambulatory Visit (HOSPITAL_COMMUNITY)
Admission: RE | Admit: 2024-01-23 | Discharge: 2024-01-23 | Disposition: A | Discharge status: Home / Self Care | Payer: PRIVATE HEALTH INSURANCE | Source: Ambulatory Visit | Attending: Family Medicine | Admitting: Family Medicine

## 2024-01-23 DIAGNOSIS — E785 Hyperlipidemia, unspecified: Secondary | ICD-10-CM | POA: Insufficient documentation
